# Patient Record
Sex: Female | Born: 1965 | Race: White | Hispanic: No | Marital: Single | Smoking: Never smoker
Health system: Southern US, Community
[De-identification: ages and names within clinical notes are randomized; demographics above are authoritative.]

## PROBLEM LIST (undated history)

## (undated) ENCOUNTER — Emergency Department (HOSPITAL_COMMUNITY): Payer: BC Managed Care – PPO

## (undated) ENCOUNTER — Emergency Department (HOSPITAL_COMMUNITY): Discharge status: Against Medical Advice | Payer: BC Managed Care – PPO

## (undated) DIAGNOSIS — K219 Gastro-esophageal reflux disease without esophagitis: Secondary | ICD-10-CM

## (undated) DIAGNOSIS — T4145XA Adverse effect of unspecified anesthetic, initial encounter: Secondary | ICD-10-CM

## (undated) DIAGNOSIS — E119 Type 2 diabetes mellitus without complications: Secondary | ICD-10-CM

## (undated) DIAGNOSIS — T8859XA Other complications of anesthesia, initial encounter: Secondary | ICD-10-CM

## (undated) DIAGNOSIS — R112 Nausea with vomiting, unspecified: Secondary | ICD-10-CM

## (undated) DIAGNOSIS — I10 Essential (primary) hypertension: Secondary | ICD-10-CM

## (undated) DIAGNOSIS — Z9889 Other specified postprocedural states: Secondary | ICD-10-CM

## (undated) DIAGNOSIS — Z8489 Family history of other specified conditions: Secondary | ICD-10-CM

---

## 1994-06-29 HISTORY — PX: ARTHROSCOPIC REPAIR ACL: SUR80

## 2001-04-08 ENCOUNTER — Encounter: Admission: RE | Admit: 2001-04-08 | Discharge: 2001-04-08 | Payer: Self-pay | Admitting: Orthopedic Surgery

## 2001-04-08 ENCOUNTER — Encounter: Payer: Self-pay | Admitting: Orthopedic Surgery

## 2001-04-19 ENCOUNTER — Ambulatory Visit (HOSPITAL_BASED_OUTPATIENT_CLINIC_OR_DEPARTMENT_OTHER): Admission: RE | Admit: 2001-04-19 | Discharge: 2001-04-19 | Payer: Self-pay | Admitting: Orthopedic Surgery

## 2003-02-06 ENCOUNTER — Ambulatory Visit (HOSPITAL_BASED_OUTPATIENT_CLINIC_OR_DEPARTMENT_OTHER): Admission: RE | Admit: 2003-02-06 | Discharge: 2003-02-06 | Payer: Self-pay | Admitting: Urology

## 2003-06-30 HISTORY — PX: ARTHROSCOPIC REPAIR ACL: SUR80

## 2010-06-29 HISTORY — PX: ABDOMINAL HYSTERECTOMY: SHX81

## 2013-07-29 ENCOUNTER — Emergency Department (HOSPITAL_COMMUNITY)
Admission: EM | Admit: 2013-07-29 | Discharge: 2013-07-29 | Discharge status: Against Medical Advice | Payer: BC Managed Care – PPO

## 2013-07-29 ENCOUNTER — Emergency Department (HOSPITAL_COMMUNITY): Payer: Worker's Compensation

## 2013-07-29 ENCOUNTER — Inpatient Hospital Stay (HOSPITAL_COMMUNITY)
Admission: EM | Admit: 2013-07-29 | Discharge: 2013-08-03 | DRG: 481 | Disposition: A | Discharge status: Skilled Nursing Facility | Payer: Worker's Compensation | Attending: Orthopaedic Surgery | Admitting: Orthopaedic Surgery

## 2013-07-29 ENCOUNTER — Encounter (HOSPITAL_COMMUNITY): Payer: Self-pay | Admitting: Emergency Medicine

## 2013-07-29 DIAGNOSIS — Z7982 Long term (current) use of aspirin: Secondary | ICD-10-CM

## 2013-07-29 DIAGNOSIS — Z6841 Body Mass Index (BMI) 40.0 and over, adult: Secondary | ICD-10-CM

## 2013-07-29 DIAGNOSIS — W19XXXA Unspecified fall, initial encounter: Secondary | ICD-10-CM | POA: Diagnosis present

## 2013-07-29 DIAGNOSIS — I10 Essential (primary) hypertension: Secondary | ICD-10-CM | POA: Diagnosis present

## 2013-07-29 DIAGNOSIS — S72009A Fracture of unspecified part of neck of unspecified femur, initial encounter for closed fracture: Secondary | ICD-10-CM | POA: Diagnosis present

## 2013-07-29 DIAGNOSIS — S72309A Unspecified fracture of shaft of unspecified femur, initial encounter for closed fracture: Secondary | ICD-10-CM | POA: Diagnosis present

## 2013-07-29 DIAGNOSIS — K219 Gastro-esophageal reflux disease without esophagitis: Secondary | ICD-10-CM | POA: Diagnosis present

## 2013-07-29 DIAGNOSIS — S7290XA Unspecified fracture of unspecified femur, initial encounter for closed fracture: Secondary | ICD-10-CM | POA: Diagnosis present

## 2013-07-29 DIAGNOSIS — E119 Type 2 diabetes mellitus without complications: Secondary | ICD-10-CM | POA: Diagnosis present

## 2013-07-29 DIAGNOSIS — Z79899 Other long term (current) drug therapy: Secondary | ICD-10-CM

## 2013-07-29 DIAGNOSIS — D62 Acute posthemorrhagic anemia: Secondary | ICD-10-CM | POA: Diagnosis not present

## 2013-07-29 HISTORY — DX: Essential (primary) hypertension: I10

## 2013-07-29 HISTORY — DX: Gastro-esophageal reflux disease without esophagitis: K21.9

## 2013-07-29 HISTORY — DX: Family history of other specified conditions: Z84.89

## 2013-07-29 HISTORY — DX: Other specified postprocedural states: R11.2

## 2013-07-29 HISTORY — DX: Nausea with vomiting, unspecified: Z98.890

## 2013-07-29 HISTORY — DX: Other complications of anesthesia, initial encounter: T88.59XA

## 2013-07-29 HISTORY — DX: Type 2 diabetes mellitus without complications: E11.9

## 2013-07-29 HISTORY — DX: Adverse effect of unspecified anesthetic, initial encounter: T41.45XA

## 2013-07-29 LAB — BASIC METABOLIC PANEL
BUN: 14 mg/dL (ref 6–23)
CO2: 27 mEq/L (ref 19–32)
Calcium: 9.9 mg/dL (ref 8.4–10.5)
Chloride: 96 mEq/L (ref 96–112)
Creatinine, Ser: 0.75 mg/dL (ref 0.50–1.10)
Glucose, Bld: 148 mg/dL — ABNORMAL HIGH (ref 70–99)
Potassium: 3.8 mEq/L (ref 3.7–5.3)
SODIUM: 137 meq/L (ref 137–147)

## 2013-07-29 LAB — URINALYSIS, ROUTINE W REFLEX MICROSCOPIC
BILIRUBIN URINE: NEGATIVE
Glucose, UA: NEGATIVE mg/dL
Hgb urine dipstick: NEGATIVE
Ketones, ur: NEGATIVE mg/dL
Leukocytes, UA: NEGATIVE
NITRITE: NEGATIVE
Protein, ur: NEGATIVE mg/dL
Specific Gravity, Urine: 1.011 (ref 1.005–1.030)
UROBILINOGEN UA: 0.2 mg/dL (ref 0.0–1.0)
pH: 5 (ref 5.0–8.0)

## 2013-07-29 LAB — CBC WITH DIFFERENTIAL/PLATELET
BASOS ABS: 0 10*3/uL (ref 0.0–0.1)
Basophils Relative: 0 % (ref 0–1)
EOS ABS: 0.4 10*3/uL (ref 0.0–0.7)
Eosinophils Relative: 3 % (ref 0–5)
HCT: 42.3 % (ref 36.0–46.0)
Hemoglobin: 14.9 g/dL (ref 12.0–15.0)
Lymphocytes Relative: 27 % (ref 12–46)
Lymphs Abs: 3 10*3/uL (ref 0.7–4.0)
MCH: 31 pg (ref 26.0–34.0)
MCHC: 35.2 g/dL (ref 30.0–36.0)
MCV: 87.9 fL (ref 78.0–100.0)
Monocytes Absolute: 0.8 10*3/uL (ref 0.1–1.0)
Monocytes Relative: 7 % (ref 3–12)
NEUTROS PCT: 63 % (ref 43–77)
Neutro Abs: 7 10*3/uL (ref 1.7–7.7)
PLATELETS: 273 10*3/uL (ref 150–400)
RBC: 4.81 MIL/uL (ref 3.87–5.11)
RDW: 12.5 % (ref 11.5–15.5)
WBC: 11.2 10*3/uL — ABNORMAL HIGH (ref 4.0–10.5)

## 2013-07-29 LAB — TYPE AND SCREEN
ABO/RH(D): O POS
ANTIBODY SCREEN: NEGATIVE

## 2013-07-29 LAB — PROTIME-INR
INR: 0.93 (ref 0.00–1.49)
Prothrombin Time: 12.3 seconds (ref 11.6–15.2)

## 2013-07-29 LAB — ABO/RH: ABO/RH(D): O POS

## 2013-07-29 MED ORDER — ONDANSETRON 4 MG PO TBDP: 4.0000 mg | ORAL_TABLET | Freq: Once | ORAL | Status: DC

## 2013-07-29 MED ORDER — MAGNESIUM CITRATE PO SOLN: 1.0000 | Freq: Once | ORAL | Status: AC | PRN

## 2013-07-29 MED ORDER — SORBITOL 70 % SOLN
30.0000 mL | Freq: Every day | Status: DC | PRN
  Filled 2013-07-29: qty 30

## 2013-07-29 MED ORDER — OXYCODONE HCL 5 MG PO TABS
5.0000 mg | ORAL_TABLET | ORAL | Status: DC | PRN
  Administered 2013-07-29: 10 mg via ORAL
  Administered 2013-08-01 – 2013-08-02 (×4): 5 mg via ORAL
  Filled 2013-07-29: qty 2
  Filled 2013-07-29: qty 1
  Filled 2013-07-29 (×7): qty 2

## 2013-07-29 MED ORDER — HYDROMORPHONE HCL PF 1 MG/ML IJ SOLN
1.0000 mg | Freq: Once | INTRAMUSCULAR | Status: AC
  Administered 2013-07-29: 1 mg via INTRAMUSCULAR
  Filled 2013-07-29: qty 1

## 2013-07-29 MED ORDER — POLYETHYLENE GLYCOL 3350 17 G PO PACK
17.0000 g | PACK | Freq: Every day | ORAL | Status: DC | PRN
Start: 2013-07-29 — End: 2013-08-03
  Filled 2013-07-29: qty 1

## 2013-07-29 MED ORDER — FENTANYL CITRATE 0.05 MG/ML IJ SOLN: 50.0000 ug | INTRAMUSCULAR | Status: DC | PRN

## 2013-07-29 MED ORDER — SENNA 8.6 MG PO TABS
1.0000 | ORAL_TABLET | Freq: Two times a day (BID) | ORAL | Status: DC
  Administered 2013-07-30 – 2013-08-03 (×8): 8.6 mg via ORAL
  Filled 2013-07-29 (×12): qty 1

## 2013-07-29 MED ORDER — SODIUM CHLORIDE 0.9 % IV SOLN
INTRAVENOUS | Status: DC
  Administered 2013-07-29: 125 mL/h via INTRAVENOUS
  Administered 2013-07-30 – 2013-07-31 (×3): via INTRAVENOUS

## 2013-07-29 MED ORDER — HYDROCODONE-ACETAMINOPHEN 5-325 MG PO TABS
1.0000 | ORAL_TABLET | Freq: Four times a day (QID) | ORAL | Status: DC | PRN
  Filled 2013-07-29 (×2): qty 2

## 2013-07-29 MED ORDER — ONDANSETRON 4 MG PO TBDP
4.0000 mg | ORAL_TABLET | Freq: Once | ORAL | Status: DC
  Filled 2013-07-29 (×2): qty 1

## 2013-07-29 MED ORDER — MORPHINE SULFATE 2 MG/ML IJ SOLN
0.5000 mg | INTRAMUSCULAR | Status: DC | PRN
  Filled 2013-07-29: qty 1

## 2013-07-29 MED ORDER — DIAZEPAM 5 MG PO TABS
5.0000 mg | ORAL_TABLET | Freq: Four times a day (QID) | ORAL | Status: DC | PRN
  Administered 2013-07-29: 5 mg via ORAL
  Filled 2013-07-29: qty 1

## 2013-07-29 NOTE — ED Notes (Signed)
Herby AbrahamXray and MD Fayrene FearingJames at bedside.

## 2013-07-29 NOTE — ED Provider Notes (Signed)
CSN: [id number]     Arrival date & time 07/29/13  1805 History   First MD Initiated Contact with Patient 07/29/13 1823     Chief Complaint  Patient presents with  . 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 She was walking from home the client. She twisted her left ankle on the curb. She fell awkwardly forward with her right leg underneath her. She felt a snap and pain in her leg. She was able to roll flat. She is unable to move further from that position because of severe pain in her right mid thigh.  No syncope. This was a mechanical fall to the posterior ankle. Has no pain in her ankle now. This happened approximately 30 minutes ago. She is n.p.o. for approximately 2-1/2 hours. Last n.p.o.  Was a rice Krispy treat.  Past Medical History  Diagnosis Date  . Hypertension   . Diabetes mellitus without complication   . GERD (gastroesophageal reflux disease)    History reviewed. No pertinent past surgical history. History reviewed. No pertinent family history. History  Substance Use Topics  . Smoking status: Never Smoker   . Smokeless tobacco: Not on file  . Alcohol Use: No   OB History   Grav Para Term Preterm Abortions TAB SAB Ect Mult Living                 Review of Systems  Constitutional: Negative for fever, chills, diaphoresis, appetite change and fatigue.  HENT: Negative for mouth sores, sore throat and trouble swallowing.   Eyes: Negative for visual disturbance.  Respiratory: Negative for cough, chest tightness, shortness of breath and wheezing.   Cardiovascular: Negative for chest pain.  Gastrointestinal: Negative for nausea, vomiting, abdominal pain, diarrhea and abdominal distention.  Endocrine: Negative for polydipsia, polyphagia and polyuria.  Genitourinary: Negative for dysuria, frequency and hematuria.  Musculoskeletal: Positive for arthralgias and gait problem.       Deformity and pain in her right thigh.  Skin: Negative for color change, pallor  and rash.  Neurological: Negative for dizziness, syncope, light-headedness and headaches.  Hematological: Does not bruise/bleed easily.  Psychiatric/Behavioral: Negative for behavioral problems and confusion.    Allergies  Review of patient's allergies indicates no known allergies.  Home Medications  No current outpatient prescriptions on file. BP 117/91  Pulse 67  Temp(Src) 97.3 F (36.3 C) (Oral)  Resp 20  SpO2 100%  LMP 07/29/2013 Physical Exam  Constitutional: She is oriented to person, place, and time. She appears well-developed and well-nourished. No distress.  Right leg Hare traction splint placed prior to arrival.  HENT:  Head: Normocephalic.  Eyes: Conjunctivae are normal. Pupils are equal, round, and reactive to light. No scleral icterus.  Neck: Normal range of motion. Neck supple. No thyromegaly present.  Cardiovascular: Normal rate and regular rhythm.  Exam reveals no gallop and no friction rub.   No murmur heard. Pulmonary/Chest: Effort normal and breath sounds normal. No respiratory distress. She has no wheezes. She has no rales.  Abdominal: Soft. Bowel sounds are normal. She exhibits no distension. There is no tenderness. There is no rebound.  Musculoskeletal: Normal range of motion.  Hair traction splint to the right lower extremities. She has good pulses in the foot. Normal feeling and sensation to the foot. No open wound. No bleeding noted. Tenderness in the mid thigh. No deformity at the hip.   Neurological: She is alert and oriented to person, place, and time.  Skin: Skin is warm and dry. No rash noted.  Psychiatric: She has a normal mood and affect. Her behavior is normal.    ED Course  Procedures (including critical care time) Labs Review Labs Reviewed  URINE CULTURE  CBC WITH DIFFERENTIAL  BASIC METABOLIC PANEL  URINALYSIS, ROUTINE W REFLEX MICROSCOPIC  PROTIME-INR  TYPE AND SCREEN   Imaging Review No results found.  EKG Interpretation    None       MDM   1. Femur fracture    X-ray showed mid shaft right femur fracture. No obvious other bony abnormalities noted. Discussed case with Dr. Glee Arvin.  He is on call for trauma and orthopedics at [facility] and has several back-to-back cases. He discussed with me that he felt he would be able to more expeditiously care for Mrs. Marlar with her transfer to the hospital. She is okay with this and consents to transfer there.    Rolland Porter, MD 07/29/13 [phone]

## 2013-07-29 NOTE — Progress Notes (Signed)
Orthopedic Tech Progress Note Patient Details:  [name] 10/06/1965 [id number]  Ortho Devices Ortho Device/Splint Location: rle (bucks traction) Ortho Device/Splint Interventions: Application  Patient ID: [name], female   DOB: 07/14/1965, 48 y.o.   MRN: [id number] Trapeze bar patient helper  [name], [name] 07/29/2013, 11:30 PM

## 2013-07-29 NOTE — H&P (Signed)
ORTHOPAEDIC CONSULTATION  REQUESTING PHYSICIAN: Amina Menchaca Glee Arvin, MD  Chief Complaint: Right femur fracture  HPI: [name] is a 48 y.o. female who complains of right femur fracture s/p mechanical fall.  Denies LOC, syncope, neck pain, abd pain.    Past Medical History  Diagnosis Date  . Hypertension   . Diabetes mellitus without complication   . GERD (gastroesophageal reflux disease)    History reviewed. No pertinent past surgical history. History   Social History  . Marital Status: Single    Spouse Name: N/A    Number of Children: N/A  . Years of Education: N/A   Social History Main Topics  . Smoking status: Never Smoker   . Smokeless tobacco: None  . Alcohol Use: No  . Drug Use: None  . Sexual Activity: None   Other Topics Concern  . None   Social History Narrative  . None   History reviewed. No pertinent family history. No Known Allergies Prior to Admission medications   Medication Sig Start Date End Date Taking? Authorizing Provider  aspirin 325 MG tablet Take 325 mg by mouth every morning.   Yes Historical Provider, MD  ibuprofen (ADVIL,MOTRIN) 200 MG tablet Take 400 mg by mouth 2 (two) times daily as needed for moderate pain.   Yes Historical Provider, MD  lisinopril-hydrochlorothiazide (PRINZIDE,ZESTORETIC) 10-12.5 MG per tablet Take 1 tablet by mouth at bedtime.   Yes Historical Provider, MD  metFORMIN (GLUCOPHAGE) 500 MG tablet Take 500 mg by mouth 2 (two) times daily with a meal.   Yes Historical Provider, MD  omega-3 acid ethyl esters (LOVAZA) 1 G capsule Take 1 g by mouth 2 (two) times daily.   Yes Historical Provider, MD  ranitidine (ZANTAC) 300 MG tablet Take 300 mg by mouth every morning.   Yes Historical Provider, MD  simvastatin (ZOCOR) 20 MG tablet Take 20 mg by mouth at bedtime.   Yes Historical Provider, MD   Dg Chest Port 1 View  07/29/2013   CLINICAL DATA:  Leg injury  EXAM: PORTABLE CHEST - 1 VIEW  COMPARISON:  10/09/2012  FINDINGS:  The heart size and mediastinal contours are within normal limits. Both lungs are clear. The visualized skeletal structures are unremarkable.  IMPRESSION: No active disease.   Electronically Signed   By: Esperanza Heir M.D.   On: 07/29/2013 19:01   Dg Femur Right Port  07/29/2013   CLINICAL DATA:  Fracture  EXAM: PORTABLE RIGHT FEMUR - 2 VIEW  COMPARISON:  None.  FINDINGS: There is a mildly comminuted displaced right mid-distal femoral diaphyseal fracture with 2 cm of lateral displacement and 15 mm of anterior displacement. There is no hip dislocation.  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  IMPRESSION: Mildly comminuted and displaced mid-distal right femoral diaphyseal fracture.   Electronically Signed   By: Elige Ko   On: 07/29/2013 19:03    Positive ROS: All other systems have been reviewed and were otherwise negative with the exception of those mentioned in the HPI and as above.  Physical Exam: General: Alert, no acute distress Cardiovascular: No pedal edema Respiratory: No cyanosis, no use of accessory musculature GI: No organomegaly, abdomen is soft and non-tender Skin: No lesions in the area of chief complaint Neurologic: Sensation intact distally Psychiatric: Patient is competent for consent with normal mood and affect Lymphatic: No axillary or cervical lymphadenopathy  MUSCULOSKELETAL:  RLE - skin intact - limited movement due to pain - NVI distally  Assessment: Right  femur fracture  Plan: - admit to ortho - plan for surgery in am - NPO after midnight - pain control  Thank you for the consult and the opportunity to see [name]  N. Glee ArvinMichael Caitlyn Buchanan, MD [facility] [phone] 7:44 PM

## 2013-07-29 NOTE — ED Notes (Signed)
Pt from home via [facility] c/o right leg injury from a fall today. Deformity noted to right lower leg. Traction in place. Good cap refill.

## 2013-07-29 NOTE — ED Notes (Signed)
Consent to be filled out by surgeon.

## 2013-07-29 NOTE — ED Notes (Signed)
Bed: [id number] Expected date: 07/29/13 Expected time: 6:07 PM Means of arrival: Ambulance Comments: ? Fx femur

## 2013-07-30 ENCOUNTER — Inpatient Hospital Stay (HOSPITAL_COMMUNITY): Payer: Worker's Compensation

## 2013-07-30 ENCOUNTER — Encounter (HOSPITAL_COMMUNITY)
Admission: EM | Disposition: A | Discharge status: Skilled Nursing Facility | Payer: Self-pay | Source: Home / Self Care | Attending: Orthopaedic Surgery

## 2013-07-30 ENCOUNTER — Inpatient Hospital Stay (HOSPITAL_COMMUNITY): Payer: Worker's Compensation | Admitting: Anesthesiology

## 2013-07-30 ENCOUNTER — Encounter (HOSPITAL_COMMUNITY): Payer: Worker's Compensation | Admitting: Anesthesiology

## 2013-07-30 DIAGNOSIS — S72309A Unspecified fracture of shaft of unspecified femur, initial encounter for closed fracture: Secondary | ICD-10-CM | POA: Diagnosis not present

## 2013-07-30 DIAGNOSIS — S7290XA Unspecified fracture of unspecified femur, initial encounter for closed fracture: Secondary | ICD-10-CM | POA: Diagnosis not present

## 2013-07-30 HISTORY — PX: FEMUR IM NAIL: SHX1597

## 2013-07-30 LAB — GLUCOSE, CAPILLARY
Glucose-Capillary: 143 mg/dL — ABNORMAL HIGH (ref 70–99)
Glucose-Capillary: 179 mg/dL — ABNORMAL HIGH (ref 70–99)

## 2013-07-30 SURGERY — INSERTION, INTRAMEDULLARY ROD, FEMUR, RETROGRADE
Anesthesia: General | Site: Leg Upper | Laterality: Right

## 2013-07-30 MED ORDER — METHOCARBAMOL 100 MG/ML IJ SOLN
500.0000 mg | Freq: Four times a day (QID) | INTRAVENOUS | Status: DC | PRN
  Filled 2013-07-30: qty 5

## 2013-07-30 MED ORDER — FENTANYL CITRATE 0.05 MG/ML IJ SOLN
INTRAMUSCULAR | Status: DC | PRN
  Administered 2013-07-30: 50 ug via INTRAVENOUS
  Administered 2013-07-30 (×2): 100 ug via INTRAVENOUS

## 2013-07-30 MED ORDER — SODIUM CHLORIDE 0.9 % IV SOLN
INTRAVENOUS | Status: DC
Start: 2013-07-30 — End: 2013-08-03
  Administered 2013-07-31: 15:00:00 via INTRAVENOUS

## 2013-07-30 MED ORDER — LIDOCAINE HCL (CARDIAC) 20 MG/ML IV SOLN
INTRAVENOUS | Status: AC
  Filled 2013-07-30: qty 5

## 2013-07-30 MED ORDER — LISINOPRIL-HYDROCHLOROTHIAZIDE 10-12.5 MG PO TABS: 1.0000 | ORAL_TABLET | Freq: Every day | ORAL | Status: DC

## 2013-07-30 MED ORDER — CEFAZOLIN SODIUM-DEXTROSE 2-3 GM-% IV SOLR
INTRAVENOUS | Status: AC
  Filled 2013-07-30: qty 50

## 2013-07-30 MED ORDER — ACETAMINOPHEN 650 MG RE SUPP: 650.0000 mg | Freq: Four times a day (QID) | RECTAL | Status: DC | PRN

## 2013-07-30 MED ORDER — SORBITOL 70 % SOLN
30.0000 mL | Freq: Every day | Status: DC | PRN
Start: 2013-07-30 — End: 2013-08-03
  Filled 2013-07-30: qty 30

## 2013-07-30 MED ORDER — SIMVASTATIN 20 MG PO TABS
20.0000 mg | ORAL_TABLET | Freq: Every day | ORAL | Status: DC
  Administered 2013-07-30 – 2013-08-02 (×4): 20 mg via ORAL
  Filled 2013-07-30 (×5): qty 1

## 2013-07-30 MED ORDER — INSULIN ASPART 100 UNIT/ML ~~LOC~~ SOLN
0.0000 [IU] | Freq: Three times a day (TID) | SUBCUTANEOUS | Status: DC
  Administered 2013-07-31 (×3): 3 [IU] via SUBCUTANEOUS
  Administered 2013-08-01 (×2): 5 [IU] via SUBCUTANEOUS
  Administered 2013-08-01: 2 [IU] via SUBCUTANEOUS
  Administered 2013-08-02: 3 [IU] via SUBCUTANEOUS
  Administered 2013-08-02: 2 [IU] via SUBCUTANEOUS
  Administered 2013-08-02: 3 [IU] via SUBCUTANEOUS
  Administered 2013-08-03 (×2): 2 [IU] via SUBCUTANEOUS

## 2013-07-30 MED ORDER — CEFAZOLIN SODIUM-DEXTROSE 2-3 GM-% IV SOLR
INTRAVENOUS | Status: DC | PRN
  Administered 2013-07-30: 2 g via INTRAVENOUS

## 2013-07-30 MED ORDER — SUCCINYLCHOLINE CHLORIDE 20 MG/ML IJ SOLN
INTRAMUSCULAR | Status: AC
  Filled 2013-07-30: qty 1

## 2013-07-30 MED ORDER — EPHEDRINE SULFATE 50 MG/ML IJ SOLN
INTRAMUSCULAR | Status: AC
  Filled 2013-07-30: qty 1

## 2013-07-30 MED ORDER — FAMOTIDINE 20 MG PO TABS
20.0000 mg | ORAL_TABLET | Freq: Every day | ORAL | Status: DC
  Administered 2013-07-30 – 2013-08-03 (×5): 20 mg via ORAL
  Filled 2013-07-30 (×5): qty 1

## 2013-07-30 MED ORDER — ALUM & MAG HYDROXIDE-SIMETH 200-200-20 MG/5ML PO SUSP
30.0000 mL | ORAL | Status: DC | PRN
Start: 2013-07-30 — End: 2013-08-03

## 2013-07-30 MED ORDER — HYDROMORPHONE HCL PF 1 MG/ML IJ SOLN
INTRAMUSCULAR | Status: AC
  Administered 2013-07-30: 0.5 mg via INTRAVENOUS
  Filled 2013-07-30: qty 1

## 2013-07-30 MED ORDER — FENTANYL CITRATE 0.05 MG/ML IJ SOLN
INTRAMUSCULAR | Status: AC
  Filled 2013-07-30: qty 5

## 2013-07-30 MED ORDER — CEFAZOLIN SODIUM 1-5 GM-% IV SOLN
INTRAVENOUS | Status: AC
  Filled 2013-07-30: qty 50

## 2013-07-30 MED ORDER — EPHEDRINE SULFATE 50 MG/ML IJ SOLN
INTRAMUSCULAR | Status: DC | PRN
  Administered 2013-07-30: 10 mg via INTRAVENOUS

## 2013-07-30 MED ORDER — MIDAZOLAM HCL 5 MG/5ML IJ SOLN
INTRAMUSCULAR | Status: DC | PRN
  Administered 2013-07-30: 2 mg via INTRAVENOUS

## 2013-07-30 MED ORDER — PROPOFOL 10 MG/ML IV BOLUS
INTRAVENOUS | Status: AC
  Filled 2013-07-30: qty 20

## 2013-07-30 MED ORDER — NEOSTIGMINE METHYLSULFATE 1 MG/ML IJ SOLN
INTRAMUSCULAR | Status: DC | PRN
  Administered 2013-07-30: 2 mg via INTRAVENOUS

## 2013-07-30 MED ORDER — ASPIRIN EC 325 MG PO TBEC
325.0000 mg | DELAYED_RELEASE_TABLET | Freq: Two times a day (BID) | ORAL | Status: DC
  Administered 2013-07-30 – 2013-08-03 (×8): 325 mg via ORAL
  Filled 2013-07-30 (×10): qty 1

## 2013-07-30 MED ORDER — SENNA 8.6 MG PO TABS
1.0000 | ORAL_TABLET | Freq: Two times a day (BID) | ORAL | Status: DC
  Administered 2013-07-30: 8.6 mg via ORAL
  Filled 2013-07-30 (×3): qty 1

## 2013-07-30 MED ORDER — ONDANSETRON HCL 4 MG/2ML IJ SOLN
4.0000 mg | Freq: Once | INTRAMUSCULAR | Status: AC
  Administered 2013-07-30: 4 mg via INTRAVENOUS

## 2013-07-30 MED ORDER — LISINOPRIL 10 MG PO TABS
10.0000 mg | ORAL_TABLET | Freq: Every day | ORAL | Status: DC
Start: 2013-07-30 — End: 2013-08-03
  Administered 2013-07-30 – 2013-08-02 (×3): 10 mg via ORAL
  Filled 2013-07-30 (×5): qty 1

## 2013-07-30 MED ORDER — ONDANSETRON HCL 4 MG/2ML IJ SOLN
INTRAMUSCULAR | Status: AC
  Filled 2013-07-30: qty 2

## 2013-07-30 MED ORDER — ROCURONIUM BROMIDE 50 MG/5ML IV SOLN
INTRAVENOUS | Status: AC
  Filled 2013-07-30: qty 1

## 2013-07-30 MED ORDER — BUPIVACAINE HCL (PF) 0.25 % IJ SOLN
INTRAMUSCULAR | Status: AC
  Filled 2013-07-30: qty 30

## 2013-07-30 MED ORDER — 0.9 % SODIUM CHLORIDE (POUR BTL) OPTIME
TOPICAL | Status: DC | PRN
  Administered 2013-07-30: 1000 mL

## 2013-07-30 MED ORDER — ASPIRIN EC 325 MG PO TBEC: 325.0000 mg | DELAYED_RELEASE_TABLET | Freq: Two times a day (BID) | ORAL | Status: AC

## 2013-07-30 MED ORDER — POLYETHYLENE GLYCOL 3350 17 G PO PACK: 17.0000 g | PACK | Freq: Every day | ORAL | Status: DC | PRN

## 2013-07-30 MED ORDER — NEOSTIGMINE METHYLSULFATE 1 MG/ML IJ SOLN
INTRAMUSCULAR | Status: AC
  Filled 2013-07-30: qty 10

## 2013-07-30 MED ORDER — ONDANSETRON HCL 4 MG PO TABS: 4.0000 mg | ORAL_TABLET | Freq: Four times a day (QID) | ORAL | Status: DC | PRN

## 2013-07-30 MED ORDER — METHOCARBAMOL 500 MG PO TABS
500.0000 mg | ORAL_TABLET | Freq: Four times a day (QID) | ORAL | Status: DC | PRN
  Administered 2013-07-30 – 2013-08-01 (×4): 500 mg via ORAL
  Filled 2013-07-30 (×4): qty 1

## 2013-07-30 MED ORDER — ROCURONIUM BROMIDE 100 MG/10ML IV SOLN
INTRAVENOUS | Status: DC | PRN
  Administered 2013-07-30: 25 mg via INTRAVENOUS
  Administered 2013-07-30: 15 mg via INTRAVENOUS

## 2013-07-30 MED ORDER — ONDANSETRON HCL 4 MG/2ML IJ SOLN
INTRAMUSCULAR | Status: AC
  Administered 2013-07-30: 4 mg via INTRAVENOUS
  Filled 2013-07-30: qty 2

## 2013-07-30 MED ORDER — OXYCODONE HCL 5 MG PO TABS: 5.0000 mg | ORAL_TABLET | ORAL | Status: AC | PRN

## 2013-07-30 MED ORDER — PROPOFOL 10 MG/ML IV BOLUS
INTRAVENOUS | Status: DC | PRN
  Administered 2013-07-30: 200 mg via INTRAVENOUS

## 2013-07-30 MED ORDER — MENTHOL 3 MG MT LOZG
1.0000 | LOZENGE | OROMUCOSAL | Status: DC | PRN
Start: 2013-07-30 — End: 2013-08-03

## 2013-07-30 MED ORDER — HYDROMORPHONE HCL PF 1 MG/ML IJ SOLN
0.2500 mg | INTRAMUSCULAR | Status: DC | PRN
  Administered 2013-07-30 (×4): 0.5 mg via INTRAVENOUS

## 2013-07-30 MED ORDER — PHENOL 1.4 % MT LIQD: 1.0000 | OROMUCOSAL | Status: DC | PRN

## 2013-07-30 MED ORDER — LACTATED RINGERS IV SOLN
INTRAVENOUS | Status: DC | PRN
  Administered 2013-07-30: 13:00:00 via INTRAVENOUS

## 2013-07-30 MED ORDER — ACETAMINOPHEN 325 MG PO TABS: 650.0000 mg | ORAL_TABLET | Freq: Four times a day (QID) | ORAL | Status: DC | PRN

## 2013-07-30 MED ORDER — MIDAZOLAM HCL 2 MG/2ML IJ SOLN
INTRAMUSCULAR | Status: AC
  Filled 2013-07-30: qty 2

## 2013-07-30 MED ORDER — ONDANSETRON HCL 4 MG/2ML IJ SOLN
4.0000 mg | Freq: Four times a day (QID) | INTRAMUSCULAR | Status: DC | PRN
Start: 2013-07-30 — End: 2013-08-03

## 2013-07-30 MED ORDER — DEXTROSE 5 % IV SOLN
3.0000 g | Freq: Four times a day (QID) | INTRAVENOUS | Status: AC
  Administered 2013-07-30 – 2013-07-31 (×3): 3 g via INTRAVENOUS
  Filled 2013-07-30 (×3): qty 3000

## 2013-07-30 MED ORDER — CEFAZOLIN SODIUM 1-5 GM-% IV SOLN
INTRAVENOUS | Status: DC | PRN
  Administered 2013-07-30: 1 g via INTRAVENOUS

## 2013-07-30 MED ORDER — HYDROCHLOROTHIAZIDE 12.5 MG PO CAPS
12.5000 mg | ORAL_CAPSULE | Freq: Every day | ORAL | Status: DC
  Administered 2013-07-30 – 2013-08-02 (×3): 12.5 mg via ORAL
  Filled 2013-07-30 (×5): qty 1

## 2013-07-30 MED ORDER — GLYCOPYRROLATE 0.2 MG/ML IJ SOLN
INTRAMUSCULAR | Status: DC | PRN
  Administered 2013-07-30: 0.4 mg via INTRAVENOUS

## 2013-07-30 MED ORDER — HYDROCODONE-ACETAMINOPHEN 5-325 MG PO TABS
1.0000 | ORAL_TABLET | Freq: Four times a day (QID) | ORAL | Status: DC | PRN
  Filled 2013-07-30: qty 2

## 2013-07-30 MED ORDER — METOCLOPRAMIDE HCL 5 MG/ML IJ SOLN: 5.0000 mg | Freq: Three times a day (TID) | INTRAMUSCULAR | Status: DC | PRN

## 2013-07-30 MED ORDER — GLYCOPYRROLATE 0.2 MG/ML IJ SOLN
INTRAMUSCULAR | Status: AC
  Filled 2013-07-30: qty 2

## 2013-07-30 MED ORDER — INSULIN ASPART 100 UNIT/ML ~~LOC~~ SOLN: 0.0000 [IU] | Freq: Every day | SUBCUTANEOUS | Status: DC

## 2013-07-30 MED ORDER — METOCLOPRAMIDE HCL 10 MG PO TABS: 5.0000 mg | ORAL_TABLET | Freq: Three times a day (TID) | ORAL | Status: DC | PRN

## 2013-07-30 MED ORDER — OMEGA-3-ACID ETHYL ESTERS 1 G PO CAPS
1.0000 g | ORAL_CAPSULE | Freq: Two times a day (BID) | ORAL | Status: DC
  Administered 2013-07-30 – 2013-08-03 (×8): 1 g via ORAL
  Filled 2013-07-30 (×9): qty 1

## 2013-07-30 MED ORDER — LIDOCAINE HCL (CARDIAC) 20 MG/ML IV SOLN
INTRAVENOUS | Status: DC | PRN
  Administered 2013-07-30: 100 mg via INTRAVENOUS

## 2013-07-30 MED ORDER — OXYCODONE HCL 5 MG PO TABS
5.0000 mg | ORAL_TABLET | ORAL | Status: DC | PRN
  Administered 2013-07-30 – 2013-08-01 (×6): 10 mg via ORAL
  Administered 2013-08-02 – 2013-08-03 (×2): 5 mg via ORAL
  Filled 2013-07-30 (×5): qty 1

## 2013-07-30 MED ORDER — SODIUM CHLORIDE 0.9 % IV SOLN
INTRAVENOUS | Status: DC | PRN
  Administered 2013-07-30 (×3): via INTRAVENOUS

## 2013-07-30 MED ORDER — MORPHINE SULFATE 2 MG/ML IJ SOLN
0.5000 mg | INTRAMUSCULAR | Status: DC | PRN
  Administered 2013-07-31: 0.5 mg via INTRAVENOUS

## 2013-07-30 MED ORDER — MAGNESIUM CITRATE PO SOLN: 1.0000 | Freq: Once | ORAL | Status: AC | PRN

## 2013-07-30 MED ORDER — SUCCINYLCHOLINE CHLORIDE 20 MG/ML IJ SOLN
INTRAMUSCULAR | Status: DC | PRN
  Administered 2013-07-30: 100 mg via INTRAVENOUS
  Administered 2013-07-30 (×2): 40 mg via INTRAVENOUS

## 2013-07-30 SURGICAL SUPPLY — 64 items
BANDAGE ELASTIC 6 VELCRO ST LF (GAUZE/BANDAGES/DRESSINGS) ×1 IMPLANT
BANDAGE GAUZE ELAST BULKY 4 IN (GAUZE/BANDAGES/DRESSINGS) ×1 IMPLANT
BIT DRILL LONG 4.0 (BIT) IMPLANT
BIT DRILL SHORT 4.0 (BIT) IMPLANT
BLADE SURG 15 STRL LF DISP TIS (BLADE) ×1 IMPLANT
BLADE SURG 15 STRL SS (BLADE) ×3
BLADE SURG ROTATE 9660 (MISCELLANEOUS) IMPLANT
CLOTH BEACON ORANGE TIMEOUT ST (SAFETY) ×3 IMPLANT
COVER SURGICAL LIGHT HANDLE (MISCELLANEOUS) ×3 IMPLANT
CUFF TOURNIQUET SINGLE 34IN LL (TOURNIQUET CUFF) IMPLANT
CUFF TOURNIQUET SINGLE 44IN (TOURNIQUET CUFF) IMPLANT
DRAPE C-ARM 42X72 X-RAY (DRAPES) ×3 IMPLANT
DRAPE INCISE IOBAN 66X45 STRL (DRAPES) ×4 IMPLANT
DRAPE ORTHO SPLIT 77X108 STRL (DRAPES) ×6
DRAPE PROXIMA HALF (DRAPES) ×6 IMPLANT
DRAPE SURG ORHT 6 SPLT 77X108 (DRAPES) ×2 IMPLANT
DRILL BIT LONG 4.0 (BIT) ×3
DRILL BIT SHORT 4.0 (BIT) ×6
DRSG ADAPTIC 3X8 NADH LF (GAUZE/BANDAGES/DRESSINGS) ×3 IMPLANT
DRSG PAD ABDOMINAL 8X10 ST (GAUZE/BANDAGES/DRESSINGS) ×6 IMPLANT
DURAPREP 26ML APPLICATOR (WOUND CARE) ×3 IMPLANT
ELECT REM PT RETURN 9FT ADLT (ELECTROSURGICAL) ×3
ELECTRODE REM PT RTRN 9FT ADLT (ELECTROSURGICAL) ×1 IMPLANT
GAUZE XEROFORM 5X9 LF (GAUZE/BANDAGES/DRESSINGS) ×2 IMPLANT
GLOVE BIO SURGEON STRL SZ7.5 (GLOVE) ×1 IMPLANT
GLOVE BIO SURGEON STRL SZ8.5 (GLOVE) ×1 IMPLANT
GLOVE BIOGEL PI IND STRL 8 (GLOVE) ×1 IMPLANT
GLOVE BIOGEL PI IND STRL 9 (GLOVE) ×1 IMPLANT
GLOVE BIOGEL PI INDICATOR 8 (GLOVE) ×2
GLOVE BIOGEL PI INDICATOR 9 (GLOVE)
GLOVE SKINSENSE NS SZ7.5 (GLOVE) ×2
GLOVE SKINSENSE NS SZ8.0 LF (GLOVE) ×4
GLOVE SKINSENSE STRL SZ7.5 (GLOVE) IMPLANT
GLOVE SKINSENSE STRL SZ8.0 LF (GLOVE) IMPLANT
GOWN STRL NON-REIN LRG LVL3 (GOWN DISPOSABLE) ×6 IMPLANT
GOWN STRL REIN XL XLG (GOWN DISPOSABLE) ×3 IMPLANT
GUIDE PIN 3.2MM (MISCELLANEOUS) ×3
GUIDE PIN ORTH 343X3.2XBRAD (MISCELLANEOUS) IMPLANT
KIT BASIN OR (CUSTOM PROCEDURE TRAY) ×3 IMPLANT
KIT ROOM TURNOVER OR (KITS) ×3 IMPLANT
MANIFOLD NEPTUNE II (INSTRUMENTS) ×1 IMPLANT
NAIL IM CANN TRIGEN 10X360 (Nail) IMPLANT
NAIL RETROGRADE 10X36 (Nail) ×2 IMPLANT
NEEDLE 22X1 1/2 (OR ONLY) (NEEDLE) ×3 IMPLANT
NS IRRIG 1000ML POUR BTL (IV SOLUTION) ×3 IMPLANT
PACK GENERAL/GYN (CUSTOM PROCEDURE TRAY) ×3 IMPLANT
PAD ARMBOARD 7.5X6 YLW CONV (MISCELLANEOUS) ×6 IMPLANT
PADDING CAST COTTON 6X4 STRL (CAST SUPPLIES) ×1 IMPLANT
SCREW TRIGEN LOW PROF 5.0X30 (Screw) ×2 IMPLANT
SCREW TRIGEN LOW PROF 5.0X50 (Screw) ×2 IMPLANT
SCREW TRIGEN LOW PROF 5.0X60 (Screw) ×2 IMPLANT
SPONGE GAUZE 4X4 12PLY (GAUZE/BANDAGES/DRESSINGS) ×4 IMPLANT
STAPLER VISISTAT 35W (STAPLE) ×2 IMPLANT
STOCKINETTE 6  STRL (DRAPES) ×2
STOCKINETTE 6 STRL (DRAPES) ×1 IMPLANT
SUT VIC AB 0 CTB1 27 (SUTURE) ×2 IMPLANT
SUT VIC AB 2-0 CTB1 (SUTURE) ×2 IMPLANT
SYR 20ML ECCENTRIC (SYRINGE) ×3 IMPLANT
TOWEL OR 17X24 6PK STRL BLUE (TOWEL DISPOSABLE) ×3 IMPLANT
TOWEL OR 17X26 10 PK STRL BLUE (TOWEL DISPOSABLE) ×3 IMPLANT
TUBE CONNECTING 12'X1/4 (SUCTIONS) ×1
TUBE CONNECTING 12X1/4 (SUCTIONS) ×1 IMPLANT
WATER STERILE IRR 1000ML POUR (IV SOLUTION) ×3 IMPLANT
YANKAUER SUCT BULB TIP NO VENT (SUCTIONS) ×4 IMPLANT

## 2013-07-30 NOTE — Preoperative (Signed)
Beta Blockers   Reason not to administer Beta Blockers:Not Applicable 

## 2013-07-30 NOTE — Progress Notes (Signed)
OT Cancellation Note  Patient Details Name: [name] MRN: [id number] DOB: 07/28/1965   Cancelled Treatment:    Reason Eval/Treat Not Completed: Medical issues which prohibited therapy. Pt currently on bedrest and for surgery today.   Earlie RavelingStraub, Aahana Elza L OTR/L [phone] 07/30/2013, 8:25 AM

## 2013-07-30 NOTE — Op Note (Signed)
   Date of Surgery: 07/30/2013  INDICATIONS: [name] is a 48 y.o.-year-old female who was involved in a mechanical fall and sustained a right femur fracture. The risks and benefits of the procedure discussed with the patient prior to the procedure and all questions were answered; consent was obtained.  PREOPERATIVE DIAGNOSIS:  right femur fracture  POSTOPERATIVE DIAGNOSIS: Same  PROCEDURE:  right femur closed reduction and retrograde intramedullary nailing.  CPT [phone]  SURGEON: N. Glee ArvinMichael Xu, M.D.  ASSISTANT: none,   ANESTHESIA:  general  IV FLUIDS AND URINE: See anesthesia record.  ESTIMATED BLOOD LOSS: 200 mL.  IMPLANTS: Smith and Nephew retrograde intramedullary nail with one proximal and two distal interlocking screws.   DRAINS: None.  COMPLICATIONS: None.  DESCRIPTION OF PROCEDURE: The patient was brought to the operating room and placed supine on the operating table.  The patient's leg had been signed prior to the procedure and this was documented.  The patient had the anesthesia placed by the anesthesiologist.  The prep verification and incision time-outs were performed to confirm that this was the correct patient, site, side and location. The patient had an SCD on the opposite lower extremity. The patient did receive antibiotics prior to the incision and was re-dosed during the procedure as needed at indicated intervals.  The patient had the lower extremity prepped and draped in the standard surgical fashion.  A patellar splitting approach to the knee was used.  The guide pin was placed at the appropriate start site and confirmed under xray.  The opening reamer was used to gain access to the canal taking care to protect the patellar cartilage and other soft tissue.  The ball-tipped guide wire was then placed up the femur after reduction of the femur was achieved.  The ball tipped guide wire was inserted to the appropriate depth and the measuring guide was used to measure off of this  after the femur was brought out to length. Sequential reaming was then performed to give some chatter.  The nail itself was then inserted over the wire and then the guide wire was removed. The distal interlocking screws were placed through the jig.  With the fracture reduced and out to length, the two proximal interlocking screws were placed under xray.  Final xrays were taken.  The wounds were copiously irrigated with saline and then the deep fascia was closed with 0 Vicryl figure-of-eight interrupted sutures. The deep skin layer was closed with 2.0 vicryl.  The skin was re-approximated with staples. The wounds were cleaned and dried a final time and a sterile dressing was placed. The patient was then transferred to a bed and left the operating room in stable condition.  All counts were correct at the end of the case.    POSTOPERATIVE PLAN: [name] will be touchdown weight bearing and will return 2 weeks for suture removal.  [name] will receive DVT prophylaxis based on other medications, activity level, and risk ratio of bleeding to thrombosis.

## 2013-07-30 NOTE — Anesthesia Postprocedure Evaluation (Signed)
  Anesthesia Post-op Note  Patient: [name]  Procedure(s) Performed: Procedure(s): INTRAMEDULLARY (IM) RETROGRADE FEMORAL NAILING (Right)  Patient Location: PACU  Anesthesia Type:General  Level of Consciousness: awake  Airway and Oxygen Therapy: Patient Spontanous Breathing  Post-op Pain: mild  Post-op Assessment: Post-op Vital signs reviewed  Post-op Vital Signs: Reviewed  Complications: No apparent anesthesia complications

## 2013-07-30 NOTE — Anesthesia Preprocedure Evaluation (Addendum)
Anesthesia Evaluation  Patient identified by MRN, date of birth, ID band Patient awake    Reviewed: Allergy & Precautions, H&P , NPO status , Patient's Chart, lab work & pertinent test results  History of Anesthesia Complications Negative for: history of anesthetic complications  Airway Mallampati: II TM Distance: >3 FB Neck ROM: Full    Dental  (+) Teeth Intact and Dental Advisory Given   Pulmonary former smoker,  breath sounds clear to auscultation        Cardiovascular hypertension, Pt. on medications Rhythm:Regular Rate:Normal     Neuro/Psych negative neurological ROS     GI/Hepatic Neg liver ROS, GERD-  Medicated and Controlled,  Endo/Other  diabetes, Type 2, Oral Hypoglycemic Agents  Renal/GU negative Renal ROS     Musculoskeletal negative musculoskeletal ROS (+)   Abdominal   Peds  Hematology negative hematology ROS (+)   Anesthesia Other Findings   Reproductive/Obstetrics                        Anesthesia Physical Anesthesia Plan  ASA: III and emergent  Anesthesia Plan: General   Post-op Pain Management:    Induction: Intravenous  Airway Management Planned: Oral ETT  Additional Equipment:   Intra-op Plan:   Post-operative Plan: Possible Post-op intubation/ventilation  Informed Consent: I have reviewed the patients History and Physical, chart, labs and discussed the procedure including the risks, benefits and alternatives for the proposed anesthesia with the patient or authorized representative who has indicated his/her understanding and acceptance.   Dental advisory given  Plan Discussed with: CRNA, Anesthesiologist and Surgeon  Anesthesia Plan Comments:         Anesthesia Quick Evaluation

## 2013-07-30 NOTE — Progress Notes (Signed)
PT Cancellation Note  Patient Details Name: Allison SinclairFrankie D Simm MRN: [id number] DOB: 07/24/1965   Cancelled Treatment:    Reason Eval/Treat Not Completed: Medical issues which prohibited therapy   For surgery today, currently on bedrest  Will be happy to proceed with PT eval postop with incr activity orders  Thanks,  Van ClinesHolly Oluwatosin Bracy, [place] CarolinaPT [phone]    Van ClinesGarrigan, Maryon Kemnitz [facility] 07/30/2013, 7:57 AM

## 2013-07-30 NOTE — Transfer of Care (Signed)
Immediate Anesthesia Transfer of Care Note  Patient: [name]  Procedure(s) Performed: Procedure(s): INTRAMEDULLARY (IM) RETROGRADE FEMORAL NAILING (Right)  Patient Location: PACU  Anesthesia Type:General  Level of Consciousness: sedated  Airway & Oxygen Therapy: Patient Spontanous Breathing and Patient connected to face mask oxygen  Post-op Assessment: Report given to PACU RN and Post -op Vital signs reviewed and stable  Post vital signs: Reviewed and stable  Complications: No apparent anesthesia complications

## 2013-07-30 NOTE — Anesthesia Procedure Notes (Signed)
Procedure Name: Intubation Date/Time: 07/30/2013 1:42 PM Performed by: Dave Mergen S Pre-anesthesia Checklist: Patient identified, Timeout performed, Emergency Drugs available, Suction available and Patient being monitored Patient Re-evaluated:Patient Re-evaluated prior to inductionOxygen Delivery Method: Circle system utilized Preoxygenation: Pre-oxygenation with 100% oxygen Intubation Type: IV induction Ventilation: Mask ventilation without difficulty Laryngoscope Size: Mac and 3 (use of glidescope d/t positon on bed) Grade View: Grade II Tube size: 7.5 mm Number of attempts: 2 Airway Equipment and Method: Video-laryngoscopy Placement Confirmation: ETT inserted through vocal cords under direct vision,  positive ETCO2 and breath sounds checked- equal and bilateral Secured at: 22 cm Tube secured with: Tape Dental Injury: Teeth and Oropharynx as per pre-operative assessment  Comments: Difficulty d/t positioning on hospital bed with traction arms in place

## 2013-07-31 ENCOUNTER — Encounter (HOSPITAL_COMMUNITY): Payer: Self-pay | Admitting: Orthopaedic Surgery

## 2013-07-31 LAB — BASIC METABOLIC PANEL
BUN: 11 mg/dL (ref 6–23)
CALCIUM: 8 mg/dL — AB (ref 8.4–10.5)
CHLORIDE: 102 meq/L (ref 96–112)
CO2: 25 mEq/L (ref 19–32)
Creatinine, Ser: 0.61 mg/dL (ref 0.50–1.10)
Glucose, Bld: 154 mg/dL — ABNORMAL HIGH (ref 70–99)
Potassium: 3.7 mEq/L (ref 3.7–5.3)
Sodium: 140 mEq/L (ref 137–147)

## 2013-07-31 LAB — CBC
HCT: 31.9 % — ABNORMAL LOW (ref 36.0–46.0)
Hemoglobin: 11 g/dL — ABNORMAL LOW (ref 12.0–15.0)
MCH: 31 pg (ref 26.0–34.0)
MCHC: 34.8 g/dL (ref 30.0–36.0)
MCV: 89.1 fL (ref 78.0–100.0)
PLATELETS: 206 10*3/uL (ref 150–400)
RBC: 3.58 MIL/uL — ABNORMAL LOW (ref 3.87–5.11)
RDW: 12.8 % (ref 11.5–15.5)
WBC: 10.6 10*3/uL — AB (ref 4.0–10.5)

## 2013-07-31 LAB — URINE CULTURE
Colony Count: NO GROWTH
Culture: NO GROWTH

## 2013-07-31 LAB — GLUCOSE, CAPILLARY
GLUCOSE-CAPILLARY: 172 mg/dL — AB (ref 70–99)
GLUCOSE-CAPILLARY: 188 mg/dL — AB (ref 70–99)
GLUCOSE-CAPILLARY: 162 mg/dL — AB (ref 70–99)
Glucose-Capillary: 190 mg/dL — ABNORMAL HIGH (ref 70–99)

## 2013-07-31 MED ORDER — ASPIRIN EC 325 MG PO TBEC: 325.0000 mg | DELAYED_RELEASE_TABLET | Freq: Two times a day (BID) | ORAL | Status: AC

## 2013-07-31 MED ORDER — BOOST / RESOURCE BREEZE PO LIQD
1.0000 | Freq: Two times a day (BID) | ORAL | Status: DC
  Administered 2013-07-31 – 2013-08-01 (×2): 1 via ORAL

## 2013-07-31 MED ORDER — OXYCODONE HCL 5 MG PO TABS: 5.0000 mg | ORAL_TABLET | ORAL | Status: AC | PRN

## 2013-07-31 NOTE — Progress Notes (Addendum)
INITIAL NUTRITION ASSESSMENT  DOCUMENTATION CODES Per approved criteria  - Morbid Obesity   INTERVENTION: Resource Breeze po BID, each supplement provides 250 kcal and 9 grams of protein  NUTRITION DIAGNOSIS: Inadequate oral intake related to nausea from medications as evidenced by poor meal completion post surgery.   Goal: Pt to meet >/= 90% of their estimated nutrition needs   Monitor:  Wt, po intake, labs  Reason for Assessment: Consult  48 y.o. female  Admitting Dx: <principal problem not specified>  ASSESSMENT: [name] is a 48 y.o. female who complains of right femur fracture s/p mechanical fall. - Pt underwent surgical repair 2/1 - Reports that she has been unable to eat due to nausea that she believes is coming from her medication.   Height: Ht Readings from Last 1 Encounters:  07/29/13 5\' 2"  (1.575 m)    Weight: Wt Readings from Last 1 Encounters:  07/29/13 270 lb (122.471 kg)    Ideal Body Weight: 50.1 kg  % Ideal Body Weight: 245%  Wt Readings from Last 10 Encounters:  07/29/13 270 lb (122.471 kg)  07/29/13 270 lb (122.471 kg)    Usual Body Weight: unknown  % Usual Body Weight: unknown  BMI:  Body mass index is 49.37 kg/(m^2).  Estimated Nutritional Needs: Kcal: 1750-2000 Protein: 75-85 g Fluid: 1.8-2.0 L/day  Skin: incision on right leg  Diet Order: Carb Control  EDUCATION NEEDS: -No education needs identified at this time   Intake/Output Summary (Last 24 hours) at 07/31/13 1329 Last data filed at 07/31/13 0900  Gross per 24 hour  Intake 4407.5 ml  Output   1920 ml  Net 2487.5 ml    Last BM: none recorded   Labs:   Recent Labs Lab 07/29/13 1850 07/31/13 0525  NA 137 140  K 3.8 3.7  CL 96 102  CO2 27 25  BUN 14 11  CREATININE 0.75 0.61  CALCIUM 9.9 8.0*  GLUCOSE 148* 154*    CBG (last 3)   Recent Labs  07/30/13 2130 07/31/13 0647 07/31/13 1144  GLUCAP 179* 172* 190*    Scheduled Meds: . aspirin  EC  325 mg Oral BID  . famotidine  20 mg Oral Daily  . hydrochlorothiazide  12.5 mg Oral Daily  . insulin aspart  0-15 Units Subcutaneous TID WC  . insulin aspart  0-5 Units Subcutaneous QHS  . lisinopril  10 mg Oral Daily  . omega-3 acid ethyl esters  1 g Oral BID  . ondansetron  4 mg Oral Once  . ondansetron  4 mg Oral Once  . senna  1 tablet Oral BID  . senna  1 tablet Oral BID  . simvastatin  20 mg Oral QHS    Continuous Infusions: . sodium chloride 125 mL/hr at 07/31/13 0246  . sodium chloride      Past Medical History  Diagnosis Date  . Hypertension   . Diabetes mellitus without complication   . GERD (gastroesophageal reflux disease)     History reviewed. No pertinent past surgical history.  Ebbie LatusHaley Hawkins RD, LDN

## 2013-07-31 NOTE — Evaluation (Signed)
Physical Therapy Evaluation Patient Details Name: [name] MRN: [id number] DOB: 03/03/1966 Today's Date: 07/31/2013 Time: 1031-1050 PT Time Calculation (min): 19 min  PT Assessment / Plan / Recommendation History of Present Illness  Pt is a 48 y/o female admitted s/p mechanical fall, sustaining a femur fracture. Pt is now s/p IM nailing and is TDWB on the R.   Clinical Impression  This patient presents with acute pain and decreased functional independence following the above mentioned procedure. At the time of PT eval, pt had difficulty maintaining TDWB status on R, and performing transfers. This patient is appropriate for skilled PT interventions to address functional limitations, improve safety and independence with functional mobility, and return to PLOF. Pt expressed desire to return home at d/c, and states she is very motivated, however if pt does not improve with functional mobility next session, may want to consider short term rehab prior to home.      PT Assessment  Patient needs continued PT services    Follow Up Recommendations  Home health PT;Supervision for mobility/OOB    Does the patient have the potential to tolerate intense rehabilitation      Barriers to Discharge        Equipment Recommendations  Rolling walker with 5" wheels;3in1 (PT)    Recommendations for Other Services     Frequency Min 5X/week    Precautions / Restrictions Precautions Precautions: Fall Restrictions Weight Bearing Restrictions: Yes RLE Weight Bearing: Touchdown weight bearing   Pertinent Vitals/Pain Pt reports 8/10 pain at rest. RN notified of request for pain medication.      Mobility  Bed Mobility Overal bed mobility: Needs Assistance Bed Mobility: Supine to Sit Supine to sit: Mod assist General bed mobility comments: VC's for sequencing and technique to transition to EOB. Assist for movement and support of RLE.  Transfers Overall transfer level: Needs assistance Equipment  used: Rolling walker (2 wheeled) Transfers: Sit to/from UGI CorporationStand;Stand Pivot Transfers Sit to Stand: Mod assist;+2 physical assistance Stand pivot transfers: Min assist General transfer comment: VC's for hand placement on seated surface prior to initiating transfers. Assist required to power-up to full stand, as well as to maintain TDWB status on the R during SPT to recliner.     Exercises General Exercises - Lower Extremity Ankle Circles/Pumps: 10 reps Quad Sets: 10 reps   PT Diagnosis: Difficulty walking;Acute pain  PT Problem List: Decreased strength;Decreased range of motion;Decreased activity tolerance;Decreased balance;Decreased mobility;Decreased knowledge of use of DME;Decreased safety awareness;Pain PT Treatment Interventions: DME instruction;Gait training;Stair training;Functional mobility training;Therapeutic activities;Therapeutic exercise;Neuromuscular re-education;Patient/family education     PT Goals(Current goals can be found in the care plan section) Acute Rehab PT Goals Patient Stated Goal: To return home PT Goal Formulation: With patient/family Time For Goal Achievement: 08/14/13 Potential to Achieve Goals: Good  Visit Information  Last PT Received On: 07/31/13 Assistance Needed: +2 (Chair follow) History of Present Illness: Pt is a 48 y/o female admitted s/p mechanical fall, sustaining a femur fracture. Pt is now s/p IM nailing and is TDWB on the R.        Prior Functioning  Home Living Family/patient expects to be discharged to:: Private residence Living Arrangements: Spouse/significant other Available Help at Discharge: Family;Available 24 hours/day Type of Home: House Home Access: Stairs to enter Enter[employer] CorporationEntrance Stairs-Number of Steps: 1 Entrance Stairs-Rails: None Home Layout: One level Home Equipment: None Prior Function Level of Independence: Independent Comments: Employed, still driving Communication Communication: No difficulties Dominant Hand: Left     Cognition  Cognition Arousal/Alertness: Awake/alert Behavior During Therapy: WFL for tasks assessed/performed Overall Cognitive Status: Within Functional Limits for tasks assessed    Extremity/Trunk Assessment Upper Extremity Assessment Upper Extremity Assessment: Defer to OT evaluation Lower Extremity Assessment Lower Extremity Assessment: RLE deficits/detail RLE Deficits / Details: Decreased strength and AROM consistent with IM nailing.  RLE: Unable to fully assess due to pain Cervical / Trunk Assessment Cervical / Trunk Assessment: Normal   Balance Balance Overall balance assessment: Needs assistance Sitting-balance support: Feet supported;Bilateral upper extremity supported Sitting balance-Leahy Scale: Fair Postural control: Posterior lean Standing balance support: Bilateral upper extremity supported Standing balance-Leahy Scale: Fair  End of Session PT - End of Session Equipment Utilized During Treatment: Gait belt Activity Tolerance: Patient limited by pain Patient left: in chair;with call bell/phone within reach Nurse Communication: Mobility status;Patient requests pain meds  GP     Ruthann Cancer 07/31/2013, 12:27 PM  Ruthann Cancer, PT, DPT [phone]

## 2013-07-31 NOTE — Care Management Note (Signed)
CARE MANAGEMENT NOTE 07/31/2013  Patient:  Hilton SinclairHART,Kodie D   Account Number:  [id number]  Date Initiated:  07/31/2013  Documentation initiated by:  Vance PeperBRADY,Verlyn Dannenberg  Subjective/Objective Assessment:   48 yr old female s/p right femur closed reduction and retro grade IM Nailing.     Action/Plan:   Anticipated DC Date:     Anticipated DC Plan:           Choice offered to / List presented to:             Status of service:  In process, will continue to follow

## 2013-07-31 NOTE — Progress Notes (Signed)
   Subjective:  Patient reports pain as mild.    Objective:   VITALS:   Filed Vitals:   07/31/13 0000 07/31/13 0153 07/31/13 0400 07/31/13 0648  BP:  134/54  104/63  Pulse:    84  Temp:  99.2 F (37.3 C)  99.4 F (37.4 C)  TempSrc:  Oral  Oral  Resp: 16 16 18 19   Height:      Weight:      SpO2: 100% 100% 98% 95%    Neurologically intact Neurovascular intact Sensation intact distally Intact pulses distally Dorsiflexion/Plantar flexion intact Incision: dressing C/D/I and no drainage No cellulitis present Compartment soft   Lab Results  Component Value Date   WBC 10.6* 07/31/2013   HGB 11.0* 07/31/2013   HCT 31.9* 07/31/2013   MCV 89.1 07/31/2013   PLT 206 07/31/2013     Assessment/Plan: 1 Day Post-Op   Problem List Items Addressed This Visit     Musculoskeletal and Integument   Femur fracture - Primary   Relevant Orders      Touch down weight bearing      Touch down weight bearing      Expected postop acute blood loss anemia - will monitor for symptoms Up with PT/OT DVT ppx - SCDs, ambulation, asa TDWB right and lower extremity Pain control Discharge planning   Cheral AlmasXu, Naiping Michael 07/31/2013, 8:19 AM [phone]

## 2013-08-01 LAB — BASIC METABOLIC PANEL
BUN: 12 mg/dL (ref 6–23)
CO2: 26 mEq/L (ref 19–32)
Calcium: 8.3 mg/dL — ABNORMAL LOW (ref 8.4–10.5)
Chloride: 96 mEq/L (ref 96–112)
Creatinine, Ser: 0.61 mg/dL (ref 0.50–1.10)
GFR calc non Af Amer: >90 mL/min (ref >90)
Glucose, Bld: 145 mg/dL — ABNORMAL HIGH (ref 70–99)
Potassium: 3.9 mEq/L (ref 3.7–5.3)
Sodium: 136 mEq/L — ABNORMAL LOW (ref 137–147)

## 2013-08-01 LAB — CBC
HCT: 29.8 % — ABNORMAL LOW (ref 36.0–46.0)
Hemoglobin: 10.3 g/dL — ABNORMAL LOW (ref 12.0–15.0)
MCH: 30.7 pg (ref 26.0–34.0)
MCHC: 34.6 g/dL (ref 30.0–36.0)
MCV: 89 fL (ref 78.0–100.0)
PLATELETS: 185 10*3/uL (ref 150–400)
RBC: 3.35 MIL/uL — AB (ref 3.87–5.11)
RDW: 12.9 % (ref 11.5–15.5)
WBC: 11.1 10*3/uL — ABNORMAL HIGH (ref 4.0–10.5)

## 2013-08-01 LAB — GLUCOSE, CAPILLARY
GLUCOSE-CAPILLARY: 133 mg/dL — AB (ref 70–99)
Glucose-Capillary: 145 mg/dL — ABNORMAL HIGH (ref 70–99)
Glucose-Capillary: 250 mg/dL — ABNORMAL HIGH (ref 70–99)
Glucose-Capillary: 201 mg/dL — ABNORMAL HIGH (ref 70–99)

## 2013-08-01 NOTE — Progress Notes (Signed)
Occupational Therapy Evaluation Patient Details Name: [name] MRN: [id number] DOB: 11/13/1965 Today's Date: 08/01/2013 Time: [id number] OT Time Calculation (min): 14 min  OT Assessment / Plan / Recommendation History of present illness Pt is a 48 y/o female admitted s/p mechanical fall, sustaining a femur fracture. Pt is now s/p IM nailing and is TDWB on the R.    Clinical Impression   Pt with significant functional decline due to the deficits below. Pt discussed rehab process and wishes to rehab at  [facility]. Pt was indepndent with all ADL and mobility.Pt presents with significant difficulties with LB ADL and moiblity and husband can not assist as needed. Pt is agreeable to SNF. Pt will benefit from skilled OT services to facilitate D/C to next venue due to below deficits.    OT Assessment  Patient needs continued OT Services    Follow Up Recommendations  SNF;Supervision/Assistance - 24 hour    Barriers to Discharge Decreased caregiver support husband not able to physically assist pt  Equipment Recommendations  3 in 1 bedside comode    Recommendations for Other Services    Frequency  Min 2X/week    Precautions / Restrictions Precautions Precautions: Fall Restrictions Weight Bearing Restrictions: Yes RLE Weight Bearing: Touchdown weight bearing   Pertinent Vitals/Pain no apparent distress     ADL  Grooming: Set up Where Assessed - Grooming: Unsupported sitting Upper Body Bathing: Set up Where Assessed - Upper Body Bathing: Unsupported sitting Lower Body Bathing: Maximal assistance Where Assessed - Lower Body Bathing: Supported sit to stand Upper Body Dressing: Set up Where Assessed - Upper Body Dressing: Unsupported sitting Lower Body Dressing: Maximal assistance Where Assessed - Lower Body Dressing: Supported sit to Pharmacist, hospitalstand Toilet Transfer: Moderate assistance;Simulated Toilet Transfer Method: Stand pivot AcupuncturistToilet Transfer Equipment: Other (comment)  Nurse, children's(recliner) Toileting - Clothing Manipulation and Hygiene: Maximal assistance Where Assessed - Toileting Clothing Manipulation and Hygiene: Sit to stand from 3-in-1 or toilet Equipment Used: Rolling walker Transfers/Ambulation Related to ADLs: mod A trasnsfer. +2 for ambulation ADL Comments: Difficulty maintaiing WBS    OT Diagnosis: Generalized weakness;Acute pain  OT Problem List: Decreased strength;Decreased activity tolerance;Impaired balance (sitting and/or standing);Decreased knowledge of use of DME or AE;Decreased knowledge of precautions;Cardiopulmonary status limiting activity;Obesity;Pain OT Treatment Interventions: Self-care/ADL training;Therapeutic exercise;DME and/or AE instruction;Therapeutic activities;Patient/family education;Balance training   OT Goals(Current goals can be found in the care plan section) Acute Rehab OT Goals Patient Stated Goal: To return home OT Goal Formulation: With patient Time For Goal Achievement: 08/15/13 Potential to Achieve Goals: Good  Visit Information  Last OT Received On: 08/01/13 Assistance Needed: +2 (Chair follow) History of Present Illness: Pt is a 48 y/o female admitted s/p mechanical fall, sustaining a femur fracture. Pt is now s/p IM nailing and is TDWB on the R.        Prior Functioning     Home Living Family/patient expects to be discharged to:: Private residence Living Arrangements: Spouse/significant other Available Help at Discharge: Family;Available 24 hours/day Type of Home: House Home Access: Stairs to enter Enter[employer] CorporationEntrance Stairs-Number of Steps: 1 Entrance Stairs-Rails: None Home Layout: One level Home Equipment: None Prior Function Level of Independence: Independent Comments: Employed, still driving Communication Communication: No difficulties Dominant Hand: Left         Vision/Perception Vision - History Baseline Vision: No visual deficits   Cognition  Cognition Arousal/Alertness: Awake/alert Behavior  During Therapy: WFL for tasks assessed/performed Overall Cognitive Status: Within Functional Limits for tasks assessed    Extremity/Trunk  Assessment Upper Extremity Assessment Upper Extremity Assessment: Overall WFL for tasks assessed Lower Extremity Assessment RLE Deficits / Details: Decreased strength and AROM consistent with IM nailing.  RLE: Unable to fully assess due to pain Cervical / Trunk Assessment Cervical / Trunk Assessment: Normal     Mobility Bed Mobility Overal bed mobility: Needs Assistance Bed Mobility: Supine to Sit Supine to sit: Mod assist General bed mobility comments: VC's for sequencing and technique to transition to EOB. Assist for movement and support of RLE.  Transfers Overall transfer level: Needs assistance Equipment used: Rolling walker (2 wheeled) Sit to Stand: Mod assist;+2 physical assistance Stand pivot transfers: Min assist General transfer comment: VC's for hand placement on seated surface prior to initiating transfers. Assist required to power-up to full stand, as well as to maintain TDWB status on the R during SPT to recliner.      Exercise Other Exercises Other Exercises: Pt educated on theraband exercises to begin - level 2   Balance Balance Overall balance assessment: Needs assistance Sitting balance-Leahy Scale: Fair Standing balance-Leahy Scale: Fair   End of Session OT - End of Session Equipment Utilized During Treatment: Rolling walker Activity Tolerance: Patient tolerated treatment well Patient left: in bed;with call bell/phone within reach;with family/visitor present Nurse Communication: Mobility status  GO     Ticia Virgo,HILLARY 08/01/2013, 4:10 PM

## 2013-08-01 NOTE — Progress Notes (Signed)
Physical Therapy Treatment Patient Details Name: [name] MRN: [id number] DOB: April 05, 1966 Today's Date: 08/01/2013 Time: 4098-1191 PT Time Calculation (min): 19 min  PT Assessment / Plan / Recommendation  History of Present Illness Pt is a 48 y/o female admitted s/p mechanical fall, sustaining a femur fracture. Pt is now s/p IM nailing and is TDWB on the R.    PT Comments   Pt progressing towards physical therapy goals. Very fatigued after gait training this afternoon, and therapeutic exercise was deferred. Pt reports that she feels unsafe returning home at this time and wanted to talk more about rehab. Answered all questions to the best of my ability, and referred her to CSW for any further questions/concerns. Therapist agrees with safety concern for home at d/c at this time, and recommend further short term rehab for strengthening and gait training.   Follow Up Recommendations  SNF     Does the patient have the potential to tolerate intense rehabilitation     Barriers to Discharge        Equipment Recommendations  Other (comment) (TBD by next venue of care)    Recommendations for Other Services    Frequency Min 5X/week   Progress towards PT Goals Progress towards PT goals: Progressing toward goals  Plan Discharge plan needs to be updated    Precautions / Restrictions Precautions Precautions: Fall Restrictions Weight Bearing Restrictions: Yes RLE Weight Bearing: Touchdown weight bearing   Pertinent Vitals/Pain Pt reports 8/10 pain at the beginning of session and RN was notified for pain medication. Pt received pain meds promptly.     Mobility  Bed Mobility Overal bed mobility: Needs Assistance Bed Mobility: Sit to Supine Supine to sit: Mod assist Sit to supine: Min assist General bed mobility comments: VC's for sequencing and technique to transition to EOB. Assist for movement and support of RLE.  Transfers Overall transfer level: Needs assistance Equipment used:  Rolling walker (2 wheeled) Transfers: Sit to/from Stand Sit to Stand: Mod assist;+2 physical assistance Stand pivot transfers: Min assist General transfer comment: VC's for hand placement on seated surface prior to initiating transfers. Assist required to power-up to full stand, as well as to maintain TDWB status on the R during SPT to recliner.  Ambulation/Gait Ambulation/Gait assistance: Min assist Ambulation Distance (Feet): 15 Feet Assistive device: Rolling walker (2 wheeled) Gait Pattern/deviations: Step-to pattern;Decreased stride length;Trunk flexed Gait velocity: Decreased Gait velocity interpretation: Below normal speed for age/gender General Gait Details: 2 seated rest breaks >2 minutes each to recover after each 5' walk. VC's for sequencing and to maintain TDWB status.     Exercises Other Exercises Other Exercises: Pt educated on theraband exercises to begin - level 2   PT Diagnosis:    PT Problem List:   PT Treatment Interventions:     PT Goals (current goals can now be found in the care plan section) Acute Rehab PT Goals Patient Stated Goal: To return home PT Goal Formulation: With patient/family Time For Goal Achievement: 08/14/13 Potential to Achieve Goals: Good  Visit Information  Last PT Received On: 08/01/13 Assistance Needed: +2 (Chair follow) History of Present Illness: Pt is a 48 y/o female admitted s/p mechanical fall, sustaining a femur fracture. Pt is now s/p IM nailing and is TDWB on the R.     Subjective Data  Subjective: "I have gotten so stiff just sitting here." Patient Stated Goal: To return home   Cognition  Cognition Arousal/Alertness: Awake/alert Behavior During Therapy: WFL for tasks assessed/performed Overall  Cognitive Status: Within Functional Limits for tasks assessed    Balance  Balance Overall balance assessment: Needs assistance Sitting-balance support: Feet supported;Bilateral upper extremity supported Sitting balance-Leahy  Scale: Good Standing balance support: Bilateral upper extremity supported Standing balance-Leahy Scale: Fair  End of Session PT - End of Session Equipment Utilized During Treatment: Gait belt Activity Tolerance: Patient limited by fatigue Patient left: in chair;with call bell/phone within reach Nurse Communication: Mobility status;Patient requests pain meds   GP     [name], [name] 08/01/2013, 4:46 PM  [name], PT, DPT [phone]

## 2013-08-01 NOTE — Discharge Summary (Addendum)
Physician Discharge Summary      Patient ID: [name] MRN: [id number] DOB/AGE: [birth year] 48 y.o.  Admit date: 07/29/2013 Discharge date: 08/01/2013  Admission Diagnoses:  Right femur fracture Morbid obesity  Discharge Diagnoses:  Active Problems:   Femur fracture   Hip fracture Morbid obesity  Past Medical History  Diagnosis Date  . Hypertension   . Diabetes mellitus without complication   . GERD (gastroesophageal reflux disease)     Surgeries: Procedure(s): INTRAMEDULLARY (IM) RETROGRADE FEMORAL NAILING on 07/29/2013 - 07/30/2013   Consultants (if any):    Discharged Condition: Improved  Hospital Course: [name] is an 48 y.o. female who was admitted 07/29/2013 with a diagnosis of right femur fracture and went to the operating room on 07/29/2013 - 07/30/2013 and underwent the above named procedures.    She was given perioperative antibiotics:  Anti-infectives   Start     Dose/Rate Route Frequency Ordered Stop   07/30/13 1900  ceFAZolin (ANCEF) 3 g in dextrose 5 % 50 mL IVPB     3 g 160 mL/hr over 30 Minutes Intravenous Every 6 hours 07/30/13 1813 07/31/13 0722   07/30/13 1321  ceFAZolin (ANCEF) 1-5 GM-% IVPB    Comments:  Mcphail, Nancy   : cabinet override      07/30/13 1321 07/31/13 0129   07/30/13 1321  ceFAZolin (ANCEF) 2-3 GM-% IVPB SOLR    Comments:  Carolyn Stare   : cabinet override      07/30/13 1321 07/31/13 0129    .  She was given sequential compression devices, early ambulation, and aspirin for DVT prophylaxis.  She benefited maximally from the hospital stay and there were no complications.    Recent vital signs:  Filed Vitals:   08/01/13 2048  BP: 138/49  Pulse: 91  Temp: 99.5 F (37.5 C)  Resp: 18    Recent laboratory studies:  Lab Results  Component Value Date   HGB 10.3* 08/01/2013   HGB 11.0* 07/31/2013   HGB 14.9 07/29/2013   Lab Results  Component Value Date   WBC 11.1* 08/01/2013   PLT 185 08/01/2013   Lab Results    Component Value Date   INR 0.93 07/29/2013   Lab Results  Component Value Date   NA 136* 08/01/2013   K 3.9 08/01/2013   CL 96 08/01/2013   CO2 26 08/01/2013   BUN 12 08/01/2013   CREATININE 0.61 08/01/2013   GLUCOSE 145* 08/01/2013    Discharge Medications:     Medication List         aspirin 325 MG tablet  Take 325 mg by mouth every morning.     aspirin EC 325 MG tablet  Take 1 tablet (325 mg total) by mouth 2 (two) times daily.     aspirin EC 325 MG tablet  Take 1 tablet (325 mg total) by mouth 2 (two) times daily.     ibuprofen 200 MG tablet  Commonly known as:  ADVIL,MOTRIN  Take 400 mg by mouth 2 (two) times daily as needed for moderate pain.     lisinopril-hydrochlorothiazide 10-12.5 MG per tablet  Commonly known as:  PRINZIDE,ZESTORETIC  Take 1 tablet by mouth at bedtime.     metFORMIN 500 MG tablet  Commonly known as:  GLUCOPHAGE  Take 500 mg by mouth 2 (two) times daily with a meal.     omega-3 acid ethyl esters 1 G capsule  Commonly known as:  LOVAZA  Take 1 g by mouth 2 (  two) times daily.     oxyCODONE 5 MG immediate release tablet  Commonly known as:  Oxy IR/ROXICODONE  Take 1-3 tablets (5-15 mg total) by mouth every 4 (four) hours as needed.     oxyCODONE 5 MG immediate release tablet  Commonly known as:  Oxy IR/ROXICODONE  Take 1-3 tablets (5-15 mg total) by mouth every 4 (four) hours as needed.     ranitidine 300 MG tablet  Commonly known as:  ZANTAC  Take 300 mg by mouth every morning.     simvastatin 20 MG tablet  Commonly known as:  ZOCOR  Take 20 mg by mouth at bedtime.        Diagnostic Studies: Dg Femur Right  07/30/2013   CLINICAL DATA:  Right femur fracture, ORIF  EXAM: RIGHT FEMUR - 2 VIEW  COMPARISON:  07/29/2013  FINDINGS: Two digital C-arm fluoroscopic images are submitted.  Images demonstrate placement of an IM nail with proximal and distal locking screws across an oblique fracture of the mid right femoral diaphysis.  IMPRESSION: Post  nailing of a right femoral diaphyseal fracture.   Electronically Signed   By: Ulyses SouthwardMark  Boles M.D.   On: 07/30/2013 16:07   Dg Chest Port 1 View  07/29/2013   CLINICAL DATA:  Leg injury  EXAM: PORTABLE CHEST - 1 VIEW  COMPARISON:  10/09/2012  FINDINGS: The heart size and mediastinal contours are within normal limits. Both lungs are clear. The visualized skeletal structures are unremarkable.  IMPRESSION: No active disease.   Electronically Signed   By: Esperanza Heiraymond  Rubner M.D.   On: 07/29/2013 19:01   Dg Femur Right Port  07/29/2013   CLINICAL DATA:  Fracture  EXAM: PORTABLE RIGHT FEMUR - 2 VIEW  COMPARISON:  None.  FINDINGS: There is a mildly comminuted displaced right mid-distal femoral diaphyseal fracture with 2 cm of lateral displacement and 15 mm of anterior displacement. There is no hip dislocation.  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  IMPRESSION: Mildly comminuted and displaced mid-distal right femoral diaphyseal fracture.   Electronically Signed   By: Elige KoHetal  Patel   On: 07/29/2013 19:03    Disposition: Skilled nursing       Discharge Orders   Future Orders Complete By Expires   Touch down weight bearing  As directed    Questions:     Laterality:     Extremity:     Touch down weight bearing  As directed    Questions:     Laterality:     Extremity:        Follow-up Information   Follow up with Cheral AlmasXu, Naiping Michael, MD In 2 weeks.   Specialty:  Orthopedic Surgery   Contact information:   [address] [place] [id number] [phone]        Signed: Cheral AlmasXu, Naiping Michael 08/01/2013, 9:45 PM

## 2013-08-01 NOTE — Progress Notes (Signed)
Subjective:  Patient reports pain as mild.    Objective:   VITALS:   Filed Vitals:   07/31/13 0400 07/31/13 0648 07/31/13 2122 08/01/13 0626  BP:  104/63 136/69 133/58  Pulse:  84 102 81  Temp:  99.4 F (37.4 C) 98.8 F (37.1 C) 98.4 F (36.9 C)  TempSrc:  Oral Oral Oral  Resp: 18 19 18 18   Height:      Weight:      SpO2: 98% 95% 96% 97%    Neurologically intact Neurovascular intact Sensation intact distally Intact pulses distally Dorsiflexion/Plantar flexion intact Incision: dressing C/D/I and no drainage No cellulitis present Compartment soft   Lab Results  Component Value Date   WBC 11.1* 08/01/2013   HGB 10.3* 08/01/2013   HCT 29.8* 08/01/2013   MCV 89.0 08/01/2013   PLT 185 08/01/2013     Assessment/Plan: 2 Days Post-Op   Problem List Items Addressed This Visit     Musculoskeletal and Integument   Femur fracture - Primary   Relevant Orders      Touch down weight bearing      Touch down weight bearing      Expected postop acute blood loss anemia - will monitor for symptoms Up with PT/OT DVT ppx - SCDs, ambulation, asa TDWB right and lower extremity Pain control Discharge planning Dressing change ordered   Cheral AlmasXu, Wilhelm Ganaway Michael 08/01/2013, 8:04 AM [phone]

## 2013-08-01 NOTE — Discharge Instructions (Signed)
1. Touchdown weight bearing to right lower extremity 2. Change dressing as needed 3. May get incisions wet after postoperative day 10

## 2013-08-02 ENCOUNTER — Encounter (HOSPITAL_COMMUNITY): Payer: Self-pay | Admitting: General Practice

## 2013-08-02 LAB — CBC
HCT: 28.8 % — ABNORMAL LOW (ref 36.0–46.0)
Hemoglobin: 10.1 g/dL — ABNORMAL LOW (ref 12.0–15.0)
MCH: 31 pg (ref 26.0–34.0)
MCHC: 35.1 g/dL (ref 30.0–36.0)
MCV: 88.3 fL (ref 78.0–100.0)
PLATELETS: 182 10*3/uL (ref 150–400)
RBC: 3.26 MIL/uL — ABNORMAL LOW (ref 3.87–5.11)
RDW: 12.7 % (ref 11.5–15.5)
WBC: 9.7 10*3/uL (ref 4.0–10.5)

## 2013-08-02 LAB — GLUCOSE, CAPILLARY
GLUCOSE-CAPILLARY: 169 mg/dL — AB (ref 70–99)
GLUCOSE-CAPILLARY: 141 mg/dL — AB (ref 70–99)
Glucose-Capillary: 176 mg/dL — ABNORMAL HIGH (ref 70–99)
Glucose-Capillary: 124 mg/dL — ABNORMAL HIGH (ref 70–99)

## 2013-08-02 NOTE — Progress Notes (Signed)
Physical Therapy Treatment Patient Details Name: Hilton SinclairFrankie D Erker MRN: [id number] DOB: 12/22/1965 Today's Date: 08/02/2013 Time: [id number] PT Time Calculation (min): 20 min  PT Assessment / Plan / Recommendation  History of Present Illness Pt is a 48 y/o female admitted s/p mechanical fall, sustaining a femur fracture. Pt is now s/p IM nailing and is TDWB on the R.    PT Comments   Pt progressing well towards physical therapy goals. She is very motivated to increase independence and was able to improve ambulation distance today.  Reports frequent use of tool OT provided for bed mobility and changing positions in the chair.    Follow Up Recommendations  SNF     Does the patient have the potential to tolerate intense rehabilitation     Barriers to Discharge        Equipment Recommendations  Other (comment) (TBD by next venue of care)    Recommendations for Other Services    Frequency Min 5X/week   Progress towards PT Goals Progress towards PT goals: Progressing toward goals  Plan Current plan remains appropriate    Precautions / Restrictions Precautions Precautions: Fall Restrictions Weight Bearing Restrictions: Yes RLE Weight Bearing: Touchdown weight bearing   Pertinent Vitals/Pain Pt reports 5/10 pain at rest; patient repositioned for comfort.     Mobility  Bed Mobility General bed mobility comments: Pt received up in recliner Transfers Overall transfer level: Needs assistance Equipment used: Rolling walker (2 wheeled) Transfers: Sit to/from Stand Sit to Stand: Min assist General transfer comment: Pt demonstrated proper hand palcement on seated surface. Assist to come to full stand.  Ambulation/Gait Ambulation/Gait assistance: Min guard Ambulation Distance (Feet): 35 Feet Assistive device: Rolling walker (2 wheeled) Gait Pattern/deviations: Step-to pattern;Decreased stride length Gait velocity: Decreased Gait velocity interpretation: Below normal speed for  age/gender General Gait Details: VC's for TDWB status on R and for slow controlled movements while  ambulating.     Exercises General Exercises - Lower Extremity Ankle Circles/Pumps: 10 reps Quad Sets: 10 reps Short Arc Quad: 10 reps Long Arc Quad: 10 reps Heel Slides: 10 reps Hip ABduction/ADduction: 10 reps   PT Diagnosis:    PT Problem List:   PT Treatment Interventions:     PT Goals (current goals can now be found in the care plan section) Acute Rehab PT Goals Patient Stated Goal: To return home PT Goal Formulation: With patient/family Time For Goal Achievement: 08/14/13 Potential to Achieve Goals: Good  Visit Information  Last PT Received On: 08/02/13 Assistance Needed: +2 (Chair follow) History of Present Illness: Pt is a 48 y/o female admitted s/p mechanical fall, sustaining a femur fracture. Pt is now s/p IM nailing and is TDWB on the R.     Subjective Data  Subjective: "I can do so much more by myself today." Patient Stated Goal: To return home   Cognition  Cognition Arousal/Alertness: Awake/alert Behavior During Therapy: WFL for tasks assessed/performed Overall Cognitive Status: Within Functional Limits for tasks assessed    Balance  Balance Overall balance assessment: Needs assistance Sitting-balance support: Feet supported;Bilateral upper extremity supported Sitting balance-Leahy Scale: Good Standing balance support: Bilateral upper extremity supported Standing balance-Leahy Scale: Fair  End of Session PT - End of Session Equipment Utilized During Treatment: Gait belt Activity Tolerance: Patient tolerated treatment well Patient left: in chair;with call bell/phone within reach;with family/visitor present Nurse Communication: Mobility status   GP     Ruthann CancerHamilton, Kelsea Mousel 08/02/2013, 4:52 PM  Ruthann CancerLaura [name], PT, DPT [phone]

## 2013-08-02 NOTE — Progress Notes (Signed)
Subjective:  Patient reports she's getting stronger every day. Very motivated to get better.  Objective:   VITALS:   Filed Vitals:   08/01/13 2048 08/02/13 0000 08/02/13 0400 08/02/13 0452  BP: 138/49   114/56  Pulse: 91   88  Temp: 99.5 F (37.5 C)   98.6 F (37 C)  TempSrc: Oral   Oral  Resp: 18 18 18 20   Height:      Weight:      SpO2: 100% 100% 100% 97%    Neurologically intact Neurovascular intact Sensation intact distally Intact pulses distally Dorsiflexion/Plantar flexion intact Incision: dressing C/D/I and no drainage No cellulitis present Compartment soft   Lab Results  Component Value Date   WBC 9.7 08/02/2013   HGB 10.1* 08/02/2013   HCT 28.8* 08/02/2013   MCV 88.3 08/02/2013   PLT 182 08/02/2013     Assessment/Plan: 3 Days Post-Op   Problem List Items Addressed This Visit     Musculoskeletal and Integument   Femur fracture - Primary   Relevant Orders      Touch down weight bearing      Touch down weight bearing      Expected postop acute blood loss anemia - will monitor for symptoms Up with PT/OT DVT ppx - SCDs, ambulation, asa TDWB right and lower extremity Pain control Discharge planning - [facility] Discharge summary pended in EPIC    Cheral AlmasXu, Naiping Michael 08/02/2013, 8:19 AM [phone]

## 2013-08-02 NOTE — Care Management Note (Signed)
[facility] Patient states she is under workers  comp but has no Lexicographercontact info. CM working on obtaining. Social worker notified of SNF need.

## 2013-08-02 NOTE — Progress Notes (Signed)
Patient has flatus and bowel sounds. Does not want any medication at this time to help with BM. Last BM 1/31. She states she does not feel bloated or uncomfortable and will alert nursing if she decides she would like further intervention.

## 2013-08-03 LAB — GLUCOSE, CAPILLARY
GLUCOSE-CAPILLARY: 147 mg/dL — AB (ref 70–99)
GLUCOSE-CAPILLARY: 160 mg/dL — AB (ref 70–99)

## 2013-08-03 NOTE — Discharge Planning (Signed)
[facility] [place] [facility]

## 2013-08-03 NOTE — Progress Notes (Signed)
Physical Therapy Treatment Patient Details Name: [name] MRN: [id number] DOB: 06/17/1966 Today's Date: 08/03/2013 Time: [id number] PT Time Calculation (min): 30 min  PT Assessment / Plan / Recommendation  History of Present Illness Pt is a 48 y/o female admitted s/p mechanical fall, sustaining a femur fracture. Pt is now s/p IM nailing and is TDWB on the R.    PT Comments   Pt is making slow steady progress with therapy. Pt is more independent with mobility, but fatigues very quickly. Pt is currently only ambulating 7 ft before needing to sit for a rest break. Will continue to focus on strengthening and activity tolerance to increase independence.   Follow Up Recommendations  SNF     Does the patient have the potential to tolerate intense rehabilitation     Barriers to Discharge        Equipment Recommendations  None recommended by PT    Recommendations for Other Services    Frequency Min 5X/week   Progress towards PT Goals Progress towards PT goals: Progressing toward goals  Plan Current plan remains appropriate    Precautions / Restrictions Precautions Precautions: Fall Restrictions Weight Bearing Restrictions: Yes RLE Weight Bearing: Touchdown weight bearing   Pertinent Vitals/Pain     Mobility  Transfers Overall transfer level: Needs assistance Equipment used: Rolling walker (2 wheeled) Transfers: Sit to/from Stand Sit to Stand: Min guard General transfer comment: Increased time and cues for TDWB, bilateral UE use on chair Ambulation/Gait Ambulation/Gait assistance: Min guard Ambulation Distance (Feet): 7 Feet (7'x1, 5'x1,3'x1) Assistive device: Rolling walker (2 wheeled) (recliner brought behind pt.) Gait Pattern/deviations: Step-to pattern;Decreased stride length Gait velocity: decreased Gait velocity interpretation: Below normal speed for age/gender General Gait Details: cues for hand placement on RW and encouragement to increase distance.    Exercises  Total Joint Exercises Quad Sets: AROM;Strengthening;Both;10 reps;Seated Hip ABduction/ADduction: AAROM;Strengthening;Right;10 reps;Seated Straight Leg Raises: AAROM;Strengthening;Right;10 reps;Seated Long Arc Quad: AROM;Strengthening;Right;10 reps;Seated   PT Diagnosis:    PT Problem List:   PT Treatment Interventions:     PT Goals (current goals can now be found in the care plan section)    Visit Information  Last PT Received On: 08/03/13 Assistance Needed: +1 History of Present Illness: Pt is a 48 y/o female admitted s/p mechanical fall, sustaining a femur fracture. Pt is now s/p IM nailing and is TDWB on the R.     Subjective Data      Cognition  Cognition Arousal/Alertness: Awake/alert Behavior During Therapy: WFL for tasks assessed/performed Overall Cognitive Status: Within Functional Limits for tasks assessed    Balance  Balance Overall balance assessment: Needs assistance  End of Session PT - End of Session Equipment Utilized During Treatment: Gait belt Activity Tolerance: Patient tolerated treatment well Patient left: in chair;with call bell/phone within reach Nurse Communication: Mobility status   GP     Greggory StallionWrisley, [name] 08/03/2013, 8:48 AM

## 2013-08-03 NOTE — Care Management Note (Signed)
CARE MANAGEMENT NOTE 08/03/2013  Patient:  Hilton SinclairHART,Chalee D   Account Number:  [id number]  Date Initiated:  07/31/2013  Documentation initiated by:  Vance PeperBRADY,Mcdaniel Ohms  Subjective/Objective Assessment:   48 yr old female s/p right femur closed reduction and retro grade IM Nailing.     Action/Plan:   [facility] Patient states she is under workers  comp but has no Lexicographercontact info. CM working on obtaining. Social worker notified of SNF need.   Anticipated DC Date:  08/03/2013   Anticipated DC Plan:  SKILLED NURSING FACILITY  In-house referral  Clinical Social Worker      DC Planning Services  CM consult      Choice offered to / List presented to:             Status of service:  Completed, signed off Medicare Important Message given?   (If response is "NO", the following Medicare IM given date fields will be blank) Date Medicare IM given:   Date Additional Medicare IM given:    Discharge Disposition:  SKILLED NURSING FACILITY  Per UR Regulation:    If discussed at Long Length of Stay Meetings, dates discussed:    Comments:  08/03/13  Vance PeperSusan Giomar Gusler, RN BSN Case Manager [phone] Patient's worker's comp Adjuster is Altha HarmShonda Kast [phone]. HR worker with [facility] [employer] is Aquilla HackerGail Smith [phone]. Provided her with Social Worker Amy Stuckey's contact number to do bed search for SNF.

## 2015-02-09 IMAGING — DX DG CHEST 1V PORT
1 series · 1 of 1 positions shown · non-contrast
Comparison: 10/09/2012

CLINICAL DATA: Leg injury

EXAM:
PORTABLE CHEST - 1 VIEW

[AP]
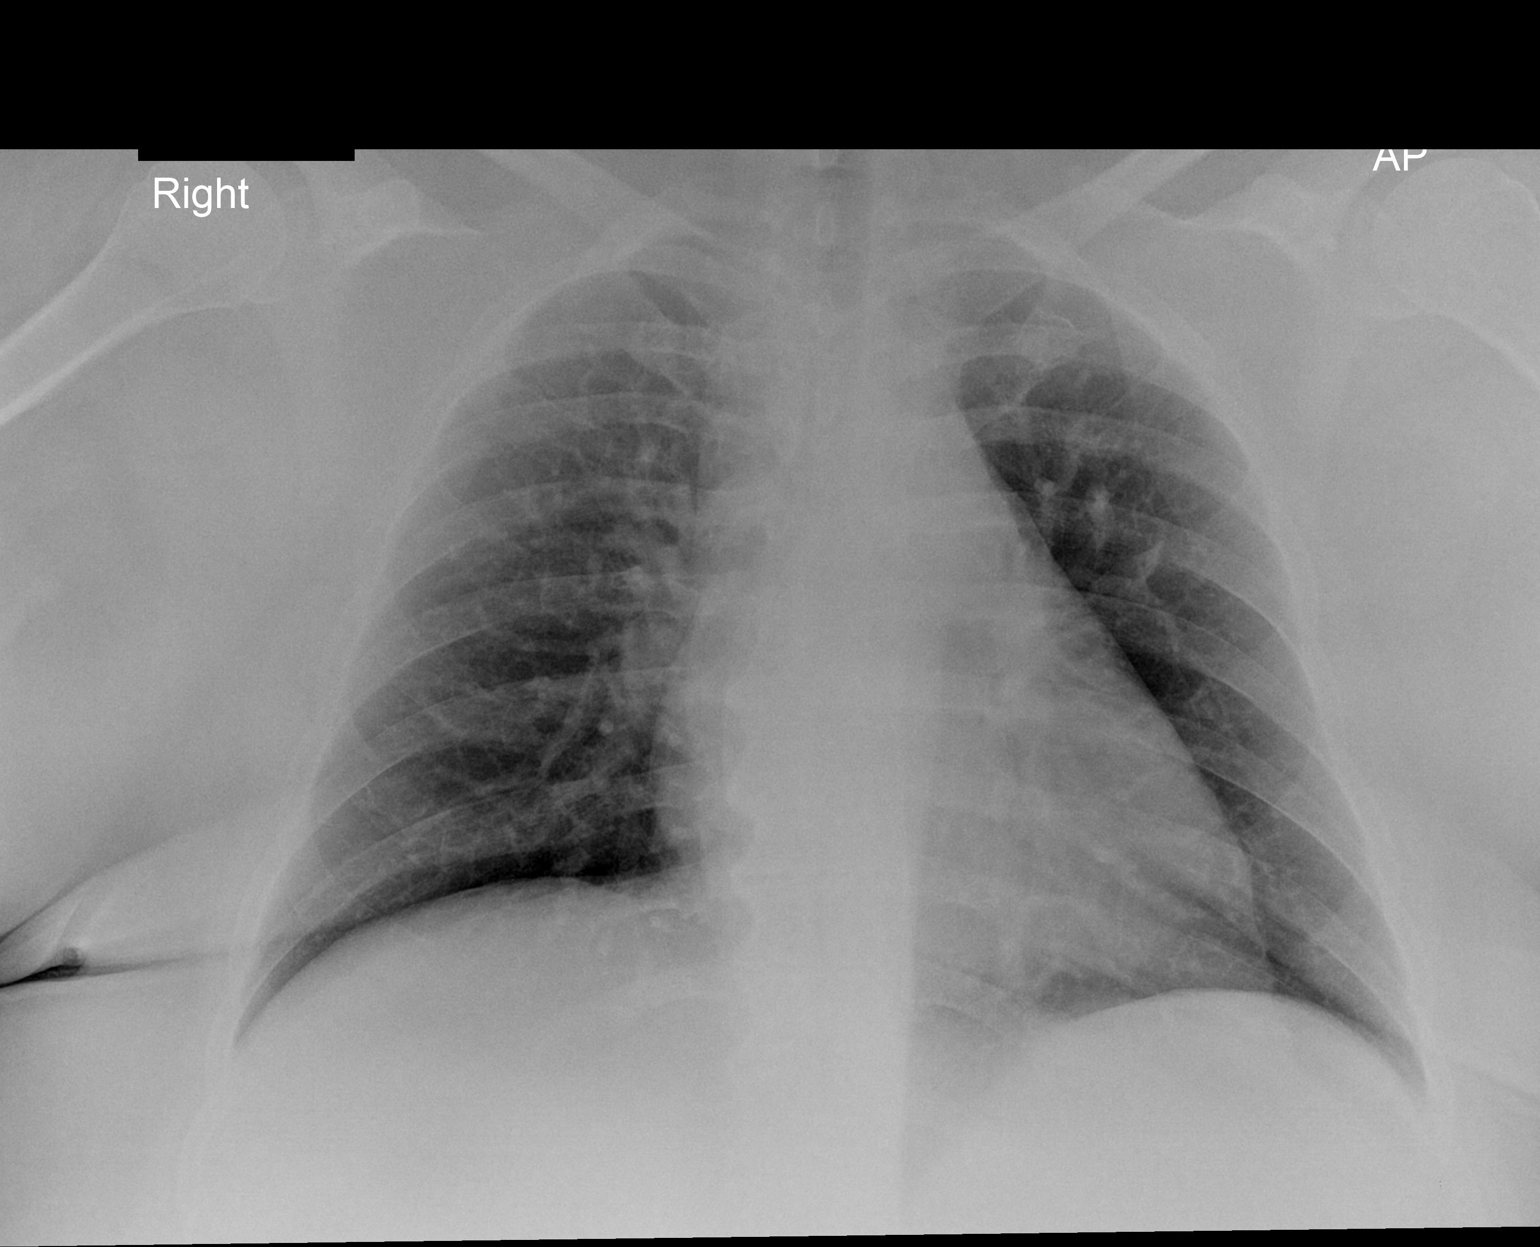

[1 of 1 positions shown; findings below may reference images not displayed]

FINDINGS: The heart size and mediastinal contours are within normal limits.
Both lungs are clear. The visualized skeletal structures are
unremarkable.
IMPRESSION: No active disease.

## 2015-02-09 IMAGING — CR DG FEMUR 2+V PORT*R*
1 series · 6 of 6 positions shown · non-contrast
Comparison: None.

CLINICAL DATA: Fracture

EXAM:
PORTABLE RIGHT FEMUR - 2 VIEW

[Series 1: AP · right · 6 of 6 slices shown]
[im 1/6]
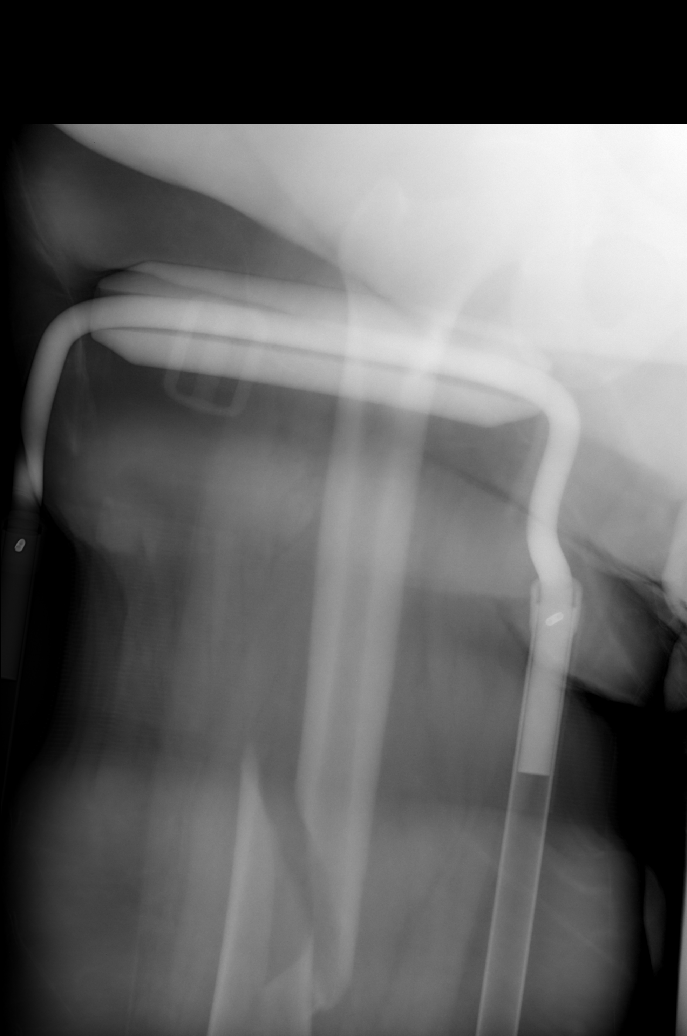
[im 2/6]
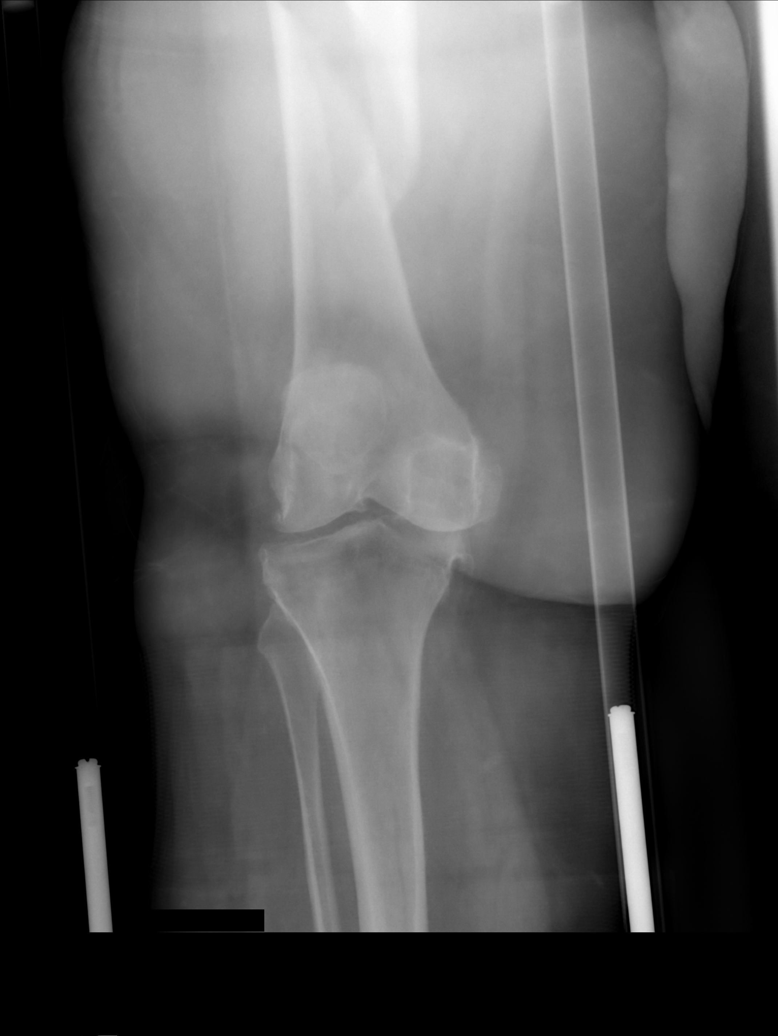
[im 3/6]
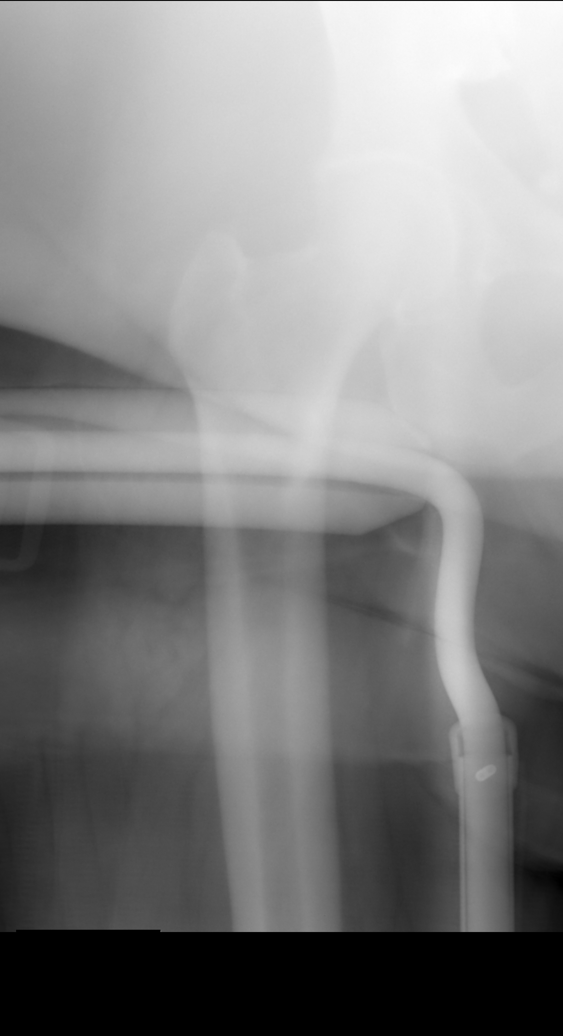
[im 4/6]
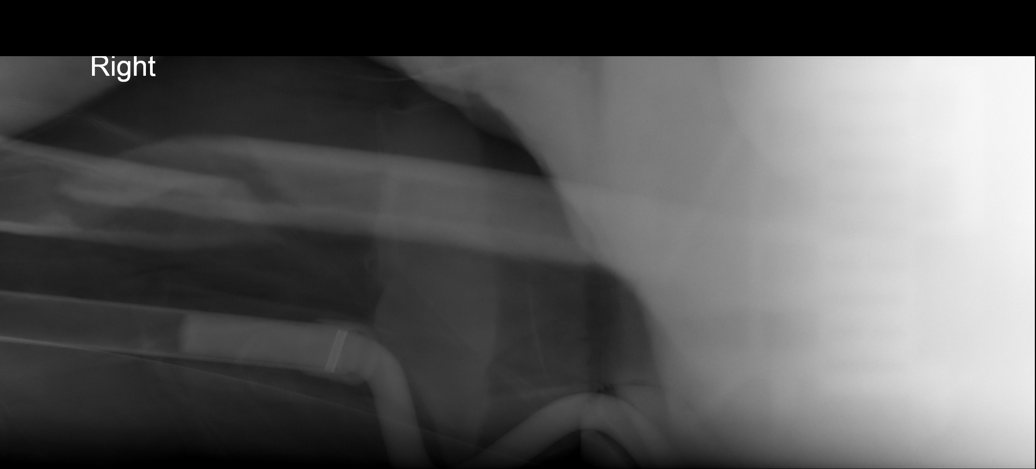
[im 5/6]
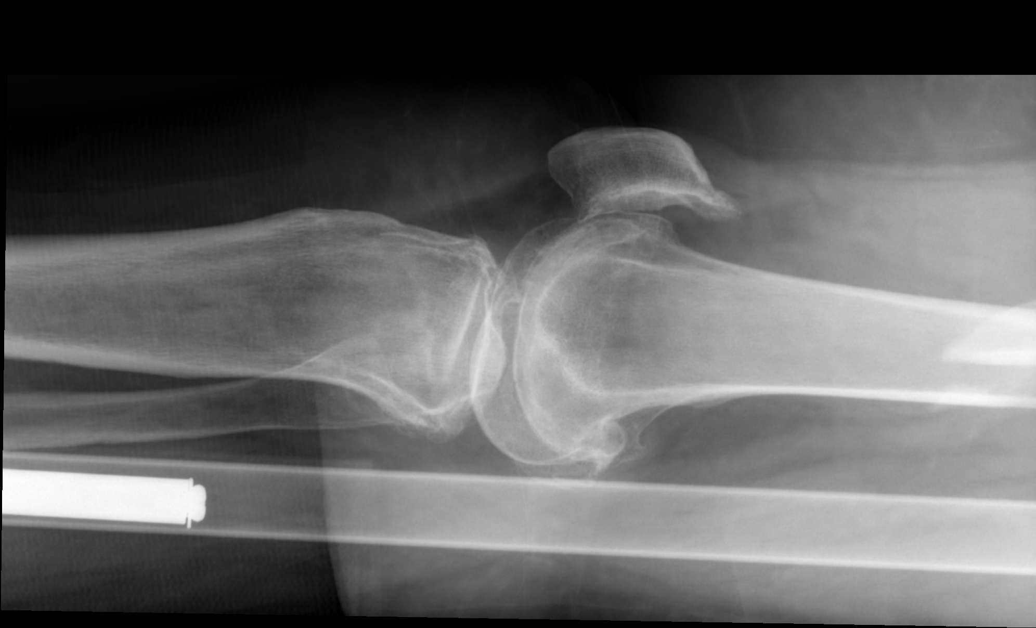
[im 6/6]
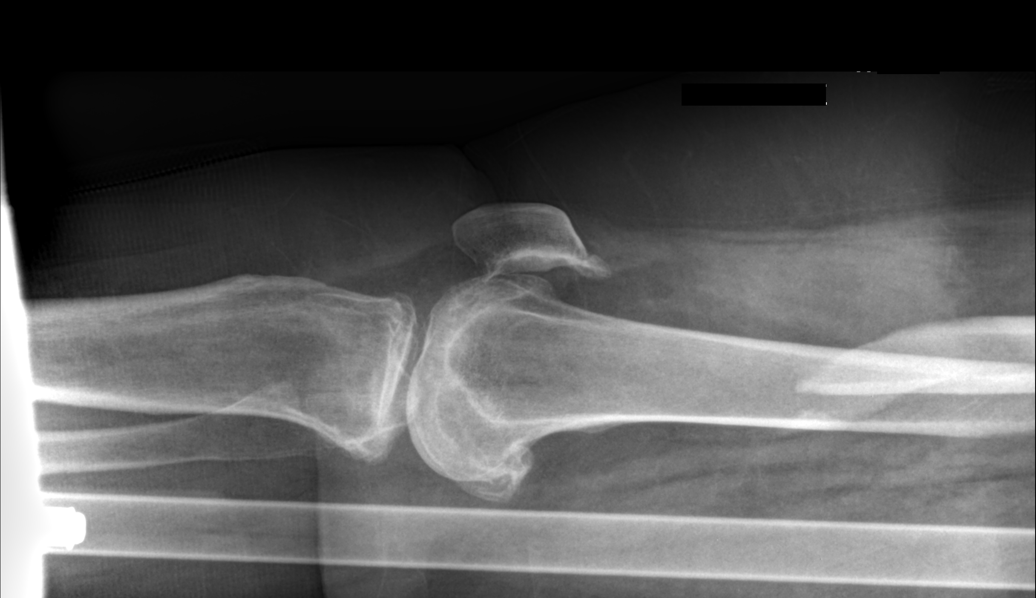

[6 of 6 positions shown; findings below may reference images not displayed]

FINDINGS: There is a mildly comminuted displaced right mid-distal femoral
diaphyseal fracture with 2 cm of lateral displacement and 15 mm of
anterior displacement. There is no hip dislocation.

There are tricompartmental osteoarthritic changes of the right knee.
IMPRESSION: Mildly comminuted and displaced mid-distal right femoral diaphyseal
fracture.

## 2015-02-10 IMAGING — RF DG C-ARM 61-120 MIN
1 series · 2 of 2 positions shown · non-contrast
Comparison: 07/29/2013

CLINICAL DATA: Right femur fracture, ORIF

EXAM:
RIGHT FEMUR - 2 VIEW

[Series 1: run · 2 of 2 slices shown]
[im 1/2]
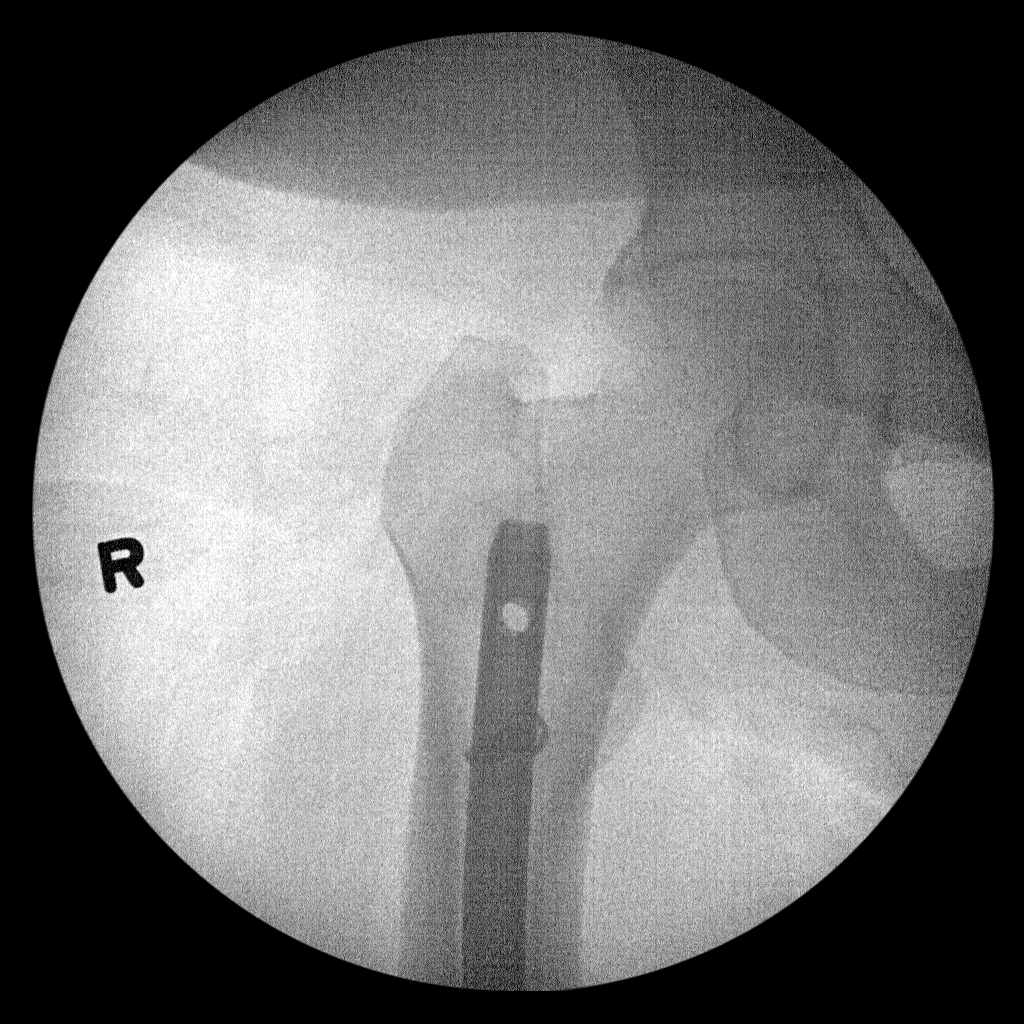
[im 2/2]
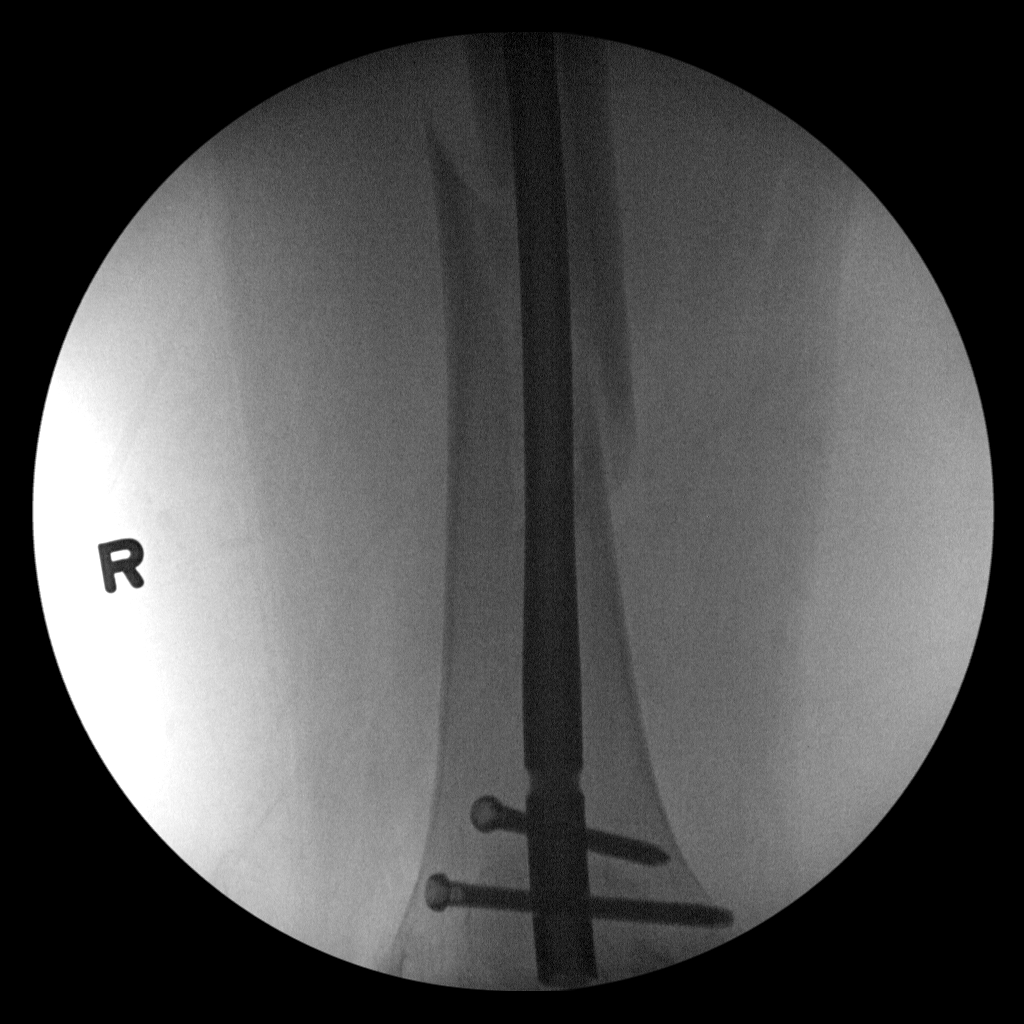

[2 of 2 positions shown; findings below may reference images not displayed]

FINDINGS: Two digital C-arm fluoroscopic images are submitted.

Images demonstrate placement of an IM nail with proximal and distal
locking screws across an oblique fracture of the mid right femoral
diaphysis.
IMPRESSION: Post nailing of a right femoral diaphyseal fracture.
